# Patient Record
Sex: Male | Born: 1945
Health system: Southern US, Community
[De-identification: ages and names within clinical notes are randomized; demographics above are authoritative.]

## PROBLEM LIST (undated history)

## (undated) DIAGNOSIS — Z1231 Encounter for screening mammogram for malignant neoplasm of breast: Principal | ICD-10-CM

## (undated) DIAGNOSIS — C61 Malignant neoplasm of prostate: Principal | ICD-10-CM

## (undated) DIAGNOSIS — M79662 Pain in left lower leg: Secondary | ICD-10-CM

## (undated) DIAGNOSIS — M170 Bilateral primary osteoarthritis of knee: Secondary | ICD-10-CM

## (undated) DIAGNOSIS — Z136 Encounter for screening for cardiovascular disorders: Secondary | ICD-10-CM

## (undated) DIAGNOSIS — M25562 Pain in left knee: Secondary | ICD-10-CM

## (undated) DIAGNOSIS — F32A Depression, unspecified: Secondary | ICD-10-CM

## (undated) DIAGNOSIS — M1990 Unspecified osteoarthritis, unspecified site: Secondary | ICD-10-CM

## (undated) DIAGNOSIS — E785 Hyperlipidemia, unspecified: Secondary | ICD-10-CM

## (undated) DIAGNOSIS — T7840XA Allergy, unspecified, initial encounter: Secondary | ICD-10-CM

## (undated) DIAGNOSIS — R011 Cardiac murmur, unspecified: Secondary | ICD-10-CM

## (undated) DIAGNOSIS — F329 Major depressive disorder, single episode, unspecified: Secondary | ICD-10-CM

## (undated) DIAGNOSIS — J301 Allergic rhinitis due to pollen: Secondary | ICD-10-CM

## (undated) DIAGNOSIS — K219 Gastro-esophageal reflux disease without esophagitis: Secondary | ICD-10-CM

## (undated) HISTORY — DX: Cardiac murmur, unspecified: R01.1

## (undated) HISTORY — DX: Depression, unspecified: F32.A

## (undated) HISTORY — DX: Hyperlipidemia, unspecified: E78.5

## (undated) HISTORY — PX: KNEE SURGERY: SHX244

## (undated) HISTORY — PX: EYE SURGERY: SHX253

## (undated) HISTORY — DX: Gastro-esophageal reflux disease without esophagitis: K21.9

## (undated) HISTORY — DX: Major depressive disorder, single episode, unspecified: F32.9

## (undated) HISTORY — DX: Allergic rhinitis due to pollen: J30.1

## (undated) HISTORY — DX: Allergy, unspecified, initial encounter: T78.40XA

## (undated) HISTORY — DX: Unspecified osteoarthritis, unspecified site: M19.90

---

## 1999-08-02 ENCOUNTER — Emergency Department (HOSPITAL_COMMUNITY): Admission: EM | Admit: 1999-08-02 | Discharge: 1999-08-02 | Payer: Self-pay | Admitting: Emergency Medicine

## 1999-08-02 ENCOUNTER — Encounter: Payer: Self-pay | Admitting: Emergency Medicine

## 2005-04-29 ENCOUNTER — Ambulatory Visit: Payer: Self-pay | Admitting: Internal Medicine

## 2005-05-09 ENCOUNTER — Ambulatory Visit (HOSPITAL_COMMUNITY): Admission: RE | Admit: 2005-05-09 | Discharge: 2005-05-09 | Payer: Self-pay | Admitting: Internal Medicine

## 2005-05-09 ENCOUNTER — Encounter (INDEPENDENT_AMBULATORY_CARE_PROVIDER_SITE_OTHER): Payer: Self-pay | Admitting: *Deleted

## 2005-05-16 ENCOUNTER — Ambulatory Visit: Payer: Self-pay | Admitting: Internal Medicine

## 2005-05-31 ENCOUNTER — Ambulatory Visit: Payer: Self-pay | Admitting: *Deleted

## 2005-08-14 ENCOUNTER — Ambulatory Visit: Payer: Self-pay | Admitting: Internal Medicine

## 2005-09-10 ENCOUNTER — Ambulatory Visit: Payer: Self-pay | Admitting: Internal Medicine

## 2005-09-24 ENCOUNTER — Ambulatory Visit: Payer: Self-pay | Admitting: Internal Medicine

## 2005-12-17 ENCOUNTER — Ambulatory Visit: Payer: Self-pay | Admitting: Internal Medicine

## 2006-03-25 ENCOUNTER — Ambulatory Visit: Payer: Self-pay | Admitting: Internal Medicine

## 2006-05-16 ENCOUNTER — Ambulatory Visit: Payer: Self-pay | Admitting: Internal Medicine

## 2006-05-16 ENCOUNTER — Ambulatory Visit (HOSPITAL_COMMUNITY): Admission: RE | Admit: 2006-05-16 | Discharge: 2006-05-16 | Payer: Self-pay | Admitting: Internal Medicine

## 2006-08-12 ENCOUNTER — Ambulatory Visit: Payer: Self-pay | Admitting: Internal Medicine

## 2006-08-13 ENCOUNTER — Ambulatory Visit (HOSPITAL_COMMUNITY): Admission: RE | Admit: 2006-08-13 | Discharge: 2006-08-13 | Payer: Self-pay | Admitting: Internal Medicine

## 2006-09-12 ENCOUNTER — Ambulatory Visit: Payer: Self-pay | Admitting: Internal Medicine

## 2006-09-14 ENCOUNTER — Ambulatory Visit (HOSPITAL_COMMUNITY): Admission: RE | Admit: 2006-09-14 | Discharge: 2006-09-14 | Payer: Self-pay | Admitting: Internal Medicine

## 2006-09-24 ENCOUNTER — Ambulatory Visit: Payer: Self-pay | Admitting: Internal Medicine

## 2006-12-04 ENCOUNTER — Ambulatory Visit: Payer: Self-pay | Admitting: Internal Medicine

## 2007-02-11 ENCOUNTER — Encounter (INDEPENDENT_AMBULATORY_CARE_PROVIDER_SITE_OTHER): Payer: Self-pay | Admitting: *Deleted

## 2007-04-14 ENCOUNTER — Ambulatory Visit: Payer: Self-pay | Admitting: Internal Medicine

## 2007-04-14 LAB — CONVERTED CEMR LAB
AST: 25 units/L (ref 0–37)
Albumin: 4.9 g/dL (ref 3.5–5.2)
BUN: 16 mg/dL (ref 6–23)
Calcium: 9.5 mg/dL (ref 8.4–10.5)
Chloride: 101 meq/L (ref 96–112)
HDL: 57 mg/dL (ref >39)
Potassium: 4.8 meq/L (ref 3.5–5.3)
Valproic Acid Lvl: 72.3 ug/mL (ref 50.0–100.0)

## 2007-05-14 ENCOUNTER — Ambulatory Visit: Payer: Self-pay | Admitting: Internal Medicine

## 2007-05-16 ENCOUNTER — Ambulatory Visit (HOSPITAL_COMMUNITY): Admission: RE | Admit: 2007-05-16 | Discharge: 2007-05-16 | Payer: Self-pay | Admitting: Internal Medicine

## 2007-06-10 ENCOUNTER — Ambulatory Visit: Payer: Self-pay | Admitting: Internal Medicine

## 2007-08-26 ENCOUNTER — Ambulatory Visit: Payer: Self-pay | Admitting: Internal Medicine

## 2007-08-26 LAB — CONVERTED CEMR LAB
ALT: 31 units/L (ref 0–53)
Albumin: 4.6 g/dL (ref 3.5–5.2)
CO2: 27 meq/L (ref 19–32)
Calcium: 9.2 mg/dL (ref 8.4–10.5)
Chloride: 103 meq/L (ref 96–112)
Cholesterol: 126 mg/dL (ref 0–200)
Potassium: 4.7 meq/L (ref 3.5–5.3)
Sodium: 142 meq/L (ref 135–145)
Total Protein: 7.3 g/dL (ref 6.0–8.3)

## 2007-09-03 ENCOUNTER — Ambulatory Visit: Payer: Self-pay | Admitting: Internal Medicine

## 2008-03-29 ENCOUNTER — Ambulatory Visit: Payer: Self-pay | Admitting: Internal Medicine

## 2008-04-12 ENCOUNTER — Ambulatory Visit: Payer: Self-pay | Admitting: Internal Medicine

## 2008-04-19 ENCOUNTER — Ambulatory Visit: Payer: Self-pay | Admitting: Family Medicine

## 2008-05-12 ENCOUNTER — Ambulatory Visit: Payer: Self-pay | Admitting: Internal Medicine

## 2009-08-08 ENCOUNTER — Encounter (INDEPENDENT_AMBULATORY_CARE_PROVIDER_SITE_OTHER): Payer: Self-pay | Admitting: *Deleted

## 2009-09-08 ENCOUNTER — Ambulatory Visit (HOSPITAL_COMMUNITY): Admission: EM | Admit: 2009-09-08 | Discharge: 2009-09-09 | Payer: Self-pay | Admitting: Emergency Medicine

## 2009-09-09 ENCOUNTER — Ambulatory Visit: Payer: Self-pay | Admitting: Gastroenterology

## 2009-09-11 ENCOUNTER — Telehealth (INDEPENDENT_AMBULATORY_CARE_PROVIDER_SITE_OTHER): Payer: Self-pay | Admitting: *Deleted

## 2009-09-11 ENCOUNTER — Encounter (INDEPENDENT_AMBULATORY_CARE_PROVIDER_SITE_OTHER): Payer: Self-pay | Admitting: *Deleted

## 2009-09-11 DIAGNOSIS — R1319 Other dysphagia: Secondary | ICD-10-CM | POA: Insufficient documentation

## 2009-11-20 ENCOUNTER — Encounter (INDEPENDENT_AMBULATORY_CARE_PROVIDER_SITE_OTHER): Payer: Self-pay | Admitting: *Deleted

## 2009-11-30 ENCOUNTER — Encounter (INDEPENDENT_AMBULATORY_CARE_PROVIDER_SITE_OTHER): Payer: Self-pay | Admitting: *Deleted

## 2009-12-01 ENCOUNTER — Ambulatory Visit: Payer: Self-pay | Admitting: Gastroenterology

## 2010-03-04 ENCOUNTER — Emergency Department (HOSPITAL_COMMUNITY): Admission: EM | Admit: 2010-03-04 | Discharge: 2010-03-04 | Payer: Self-pay | Admitting: Emergency Medicine

## 2010-06-28 NOTE — Procedures (Signed)
Summary: Upper Endoscopy  Patient: [name] Note: All result statuses are Final unless otherwise noted.  Tests: (1) Upper Endoscopy (EGD)   EGD Upper Endoscopy       DONE     [facility]     [address] [place], [place]  [address]           ENDOSCOPY PROCEDURE REPORT           PATIENT:  Casey Ashley, Casey Ashley  MR#:  [id number]     BIRTHDATE:  24-Dec-1945, 63 yrs. old  GENDER:  male           ENDOSCOPIST:  Rachael Fee, MD           PROCEDURE DATE:  09/09/2009     PROCEDURE:  EGD with foreign body removal     ASA CLASS:  Class II     INDICATIONS:  food impaction           MEDICATIONS:  Fentanyl 75 mcg IV, Versed 6 mg IV     TOPICAL ANESTHETIC:  none           DESCRIPTION OF PROCEDURE:   After the risks benefits and     alternatives of the procedure were thoroughly explained, informed     consent was obtained.  The  endoscope was introduced through the     mouth and advanced to the second portion of the duodenum, without     limitations.  The instrument was slowly withdrawn as the mucosa     was fully examined.     <<PROCEDUREIMAGES>>           There was a bolus of solid whitish food in distal esophagus that     was able to be gently pushed into the stomach with endoscope.     There was a thick Schatzki's type ring vs peptic stricture at GE     junction. The entire esophagus was edematous, erythematous. There     was a large amount of retained solid food in stomach. Otherwise     the examination was normal (see image1, image2, image3, image4,     image5, image7, image8, and image9).    Retroflexed views revealed     no abnormalities.    The scope was then withdrawn from the patient     and the procedure completed.           COMPLICATIONS:  None           ENDOSCOPIC IMPRESSION:     1) Retained food in esophagus, above thick Schatzki's ring vs     peptic stricture     2) Esophagus appeared moderately inflammed throughout, probably     resulting from the  food impaction and retching tonight.           RECOMMENDATIONS:     1. He will start once daily over the counter PPI (prilosec or     prevacid). This is best taken 20-30 min prior to breakfast meal.     2.  He will maintain full liquid diet for 24 hours, then OK to     advance but he knows to chew food VERY WELL and eat slowly.     3.  Dr. Christella Hartigan' office with contact him to schedule repeat EGD     with dilation at [facility] in about 2 weeks.           ______________________________     Rachael Fee,  MD           n.     Rosalie DoctorRachael Fee at 09/09/2009 04:04 AM           Casey Ashley, [id number]  Note: An exclamation mark (!) indicates a result that was not dispersed into the flowsheet. Document Creation Date: 09/09/2009 4:04 AM _______________________________________________________________________  (1) Order result status: Final Collection or observation date-time: 09/09/2009 03:56 Requested date-time:  Receipt date-time:  Reported date-time:  Referring Physician:   Ordering Physician: Rob Bunting [phone]) Specimen Source:  Source: Launa Grill Order Number: [id number] Lab site:   Appended Document: Upper Endoscopy patty, he needs EGD in 2 weeks for dilation at [facility] with balloon (on an EUS thursday)  Appended Document: Upper Endoscopy appt scheduled for 09/28/09

## 2010-06-28 NOTE — Progress Notes (Signed)
Summary: EGD   Phone Note Outgoing Call Call back at [facility] Phone [phone]   Call placed by: Chales Abrahams CMA Duncan Dull),  September 11, 2009 8:40 AM Summary of Call: pt schedueld for repeat EGD ballon dil review meds and instruct pt.   Initial call taken by: Chales Abrahams CMA Duncan Dull),  September 11, 2009 8:41 AM  Follow-up for Phone Call        left message on machine to call back Chales Abrahams CMA Duncan Dull)  September 11, 2009 9:14 AM   Called to give pt the appt.  and he refuses to have the  EGD he says he does not need it he is doing fine.  I explained that I would let Dr Christella Hartigan know but he may still want to do the procedure.  I will call him back after talking with Dr Christella Hartigan.  Chales Abrahams CMA Duncan Dull)  September 11, 2009 9:33 AM   Additional Follow-up for Phone Call Additional follow up Details #1::        call him back, he needs the procedure.  if he still declines please send him a note saying I recommended he have the repeat procedure done, Additional Follow-up by: Rachael Fee MD,  September 11, 2009 10:13 AM  New Problems: OTHER DYSPHAGIA [phone])   Additional Follow-up for Phone Call Additional follow up Details #2::    left message on machine to call back Chales Abrahams CMA (AAMA)  September 11, 2009 10:18 AM   Pt continues to decline to have procedure.  He was told that Dr Christella Hartigan still recommends he have the EGD and a letter will be sent,  he will call with any further problems.  Follow-up by: Chales Abrahams CMA Duncan Dull),  September 11, 2009 10:26 AM  New Problems: OTHER DYSPHAGIA [phone])

## 2010-06-28 NOTE — Letter (Signed)
Summary: Results Letter  [facility]  [address] [place], [place] [address]   Phone: [phone]  Fax: [phone]        September 11, 2009 MRN: [id number]    Casey Ashley [address] [place], [place]  [address]    Dear Mr. Mccaslin,  As per our conversation, Dr Christella Hartigan recommends you have an EGD at [facility].  You have declined to have this procedure at this time.  Please be aware that Dr Christella Hartigan does feel that you should have this done at your soonest convenience.  Please call if you have any further problems.       Sincerely,  Chales Abrahams CMA (AAMA)  This letter has been electronically signed by your physician.  Appended Document: Results Letter letter mailed

## 2010-06-28 NOTE — Letter (Signed)
Summary: Previsit letter  [facility]  [address] [place], [place] [address]   Phone: [phone]  Fax: [phone]       11/20/2009 MRN: [id number]  Casey Ashley [address] [place], [place]  [address]  Dear [name],  Welcome to the Gastroenterology Division at [facility].    You are scheduled to see a nurse for your pre-procedure visit on 12-25-09 at 9:00a.m. on the 3rd floor at [facility], [address].  We ask that you try to arrive at our office 15 minutes prior to your appointment time to allow for check-in.  Your nurse visit will consist of discussing your medical and surgical history, your immediate family medical history, and your medications.    Please bring a complete list of all your medications or, if you prefer, bring the medication bottles and we will list them.  We will need to be aware of both prescribed and over the counter drugs.  We will need to know exact dosage information as well.  If you are on blood thinners (Coumadin, Plavix, Aggrenox, Ticlid, etc.) please call our office today/prior to your appointment, as we need to consult with your physician about holding your medication.   Please be prepared to read and sign documents such as consent forms, a financial agreement, and acknowledgement forms.  If necessary, and with your consent, a friend or relative is welcome to sit-in on the nurse visit with you.  Please bring your insurance card so that we may make a copy of it.  If your insurance requires a referral to see a specialist, please bring your referral form from your primary care physician.  No co-pay is required for this nurse visit.     If you cannot keep your appointment, please call [phone] to cancel or reschedule prior to your appointment date.  This allows [place] the opportunity to schedule an appointment for another patient in need of care.    Thank you for choosing [facility] for your medical needs.  We  appreciate the opportunity to care for you.  Please visit [place] at our website  to learn more about our practice.                     Sincerely.                                                                                                                   The Gastroenterology Division

## 2010-06-28 NOTE — Letter (Signed)
Summary: EGD Instructions  [facility]  [address] [place], [place] [address]   Phone: [phone]  Fax: [phone]       CAYMAN KIELBASA    June 14, 1945    MRN: [id number]       Procedure Day /Date:09/28/09       Arrival Time: 11 am     Procedure Time:12 noon     Location of Procedure:                     [facility] ( Outpatient Registration)    PREPARATION FOR ENDOSCOPY   On 09/28/09  THE DAY OF THE PROCEDURE:  1.   No solid foods, milk or milk products are allowed after midnight the night before your procedure.  2.   Do not drink anything colored red or purple.  Avoid juices with pulp.  No orange juice.  3.  You may drink clear liquids until 8 am , which is 4 hours before your procedure.                                                                                                CLEAR LIQUIDS INCLUDE: Water Jello Ice Popsicles Tea (sugar ok, no milk/cream) Powdered fruit flavored drinks Coffee (sugar ok, no milk/cream) Gatorade Juice: apple, white grape, white cranberry  Lemonade Clear bullion, consomm, broth Carbonated beverages (any kind) Strained chicken noodle soup Hard Candy   MEDICATION INSTRUCTIONS  Unless otherwise instructed, you should take regular prescription medications with a small sip of water as early as possible the morning of your procedure.             OTHER INSTRUCTIONS  You will need a responsible adult at least 67 years of age to accompany you and drive you home.   This person must remain in the waiting room during your procedure.  Wear loose fitting clothing that is easily removed.  Leave jewelry and other valuables at home.  However, you may wish to bring a book to read or an iPod/MP3 player to listen to music as you wait for your procedure to start.  Remove all body piercing jewelry and leave at home.  Total time from sign-in until discharge is approximately 2-3 hours.  You should go home  directly after your procedure and rest.  You can resume normal activities the day after your procedure.  The day of your procedure you should not:   Drive   Make legal decisions   Operate machinery   Drink alcohol   Return to work  You will receive specific instructions about eating, activities and medications before you leave.    The above instructions have been reviewed and explained to me by   Chales Abrahams CMA Duncan Dull)  September 11, 2009 8:43 AM      I fully understand and can verbalize these instructions over the phone mailed to [facility] 09/11/09     Appended Document: EGD Instructions pt declined procedure

## 2010-06-28 NOTE — Letter (Signed)
Summary: Previsit letter  [facility]  [address] [place], [place] [address]   Phone: [phone]  Fax: [phone]       08/08/2009 MRN: [id number]  Casey Ashley [address] [place], [place]  [address]  Dear [name],  Welcome to the Gastroenterology Division at [facility].    You are scheduled to see a nurse for your pre-procedure visit on 08-22-09 at 2:00p.m. on the 3rd floor at [facility], [address].  We ask that you try to arrive at our office 15 minutes prior to your appointment time to allow for check-in.  Your nurse visit will consist of discussing your medical and surgical history, your immediate family medical history, and your medications.    Please bring a complete list of all your medications or, if you prefer, bring the medication bottles and we will list them.  We will need to be aware of both prescribed and over the counter drugs.  We will need to know exact dosage information as well.  If you are on blood thinners (Coumadin, Plavix, Aggrenox, Ticlid, etc.) please call our office today/prior to your appointment, as we need to consult with your physician about holding your medication.   Please be prepared to read and sign documents such as consent forms, a financial agreement, and acknowledgement forms.  If necessary, and with your consent, a friend or relative is welcome to sit-in on the nurse visit with you.  Please bring your insurance card so that we may make a copy of it.  If your insurance requires a referral to see a specialist, please bring your referral form from your primary care physician.  No co-pay is required for this nurse visit.     If you cannot keep your appointment, please call [phone] to cancel or reschedule prior to your appointment date.  This allows [place] the opportunity to schedule an appointment for another patient in need of care.    Thank you for choosing [facility] for your medical needs.  We  appreciate the opportunity to care for you.  Please visit [place] at our website  to learn more about our practice.                     Sincerely.                                                                                                                   The Gastroenterology Division

## 2010-06-28 NOTE — Miscellaneous (Signed)
Summary: previsit/rm  Clinical Lists Changes  Medications: Added new medication of MOVIPREP 100 GM  SOLR (PEG-KCL-NACL-NASULF-NA ASC-C) As per prep instructions. - Signed Rx of MOVIPREP 100 GM  SOLR (PEG-KCL-NACL-NASULF-NA ASC-C) As per prep instructions.;  #1 x 0;  Signed;  Entered by: Sherren Kerns RN;  Authorized by: Rachael Fee MD;  Method used: Electronically to [facility] [id number]*, [address] [place], [place], [place]  [address], Ph: [phone] or [phone], Fax: [phone] Observations: Added new observation of ALLERGY REV: Done (12/01/2009 14:04) Added new observation of NKA: T (12/01/2009 14:04)    Prescriptions: MOVIPREP 100 GM  SOLR (PEG-KCL-NACL-NASULF-NA ASC-C) As per prep instructions.  #1 x 0   Entered by:   Sherren Kerns RN   Authorized by:   Rachael Fee MD   Signed by:   Sherren Kerns RN on 12/01/2009   Method used:   Electronically to        [id number]* (retail)       [address]       [place], [place]  [address]       Ph: [phone] or [phone]       Fax: [phone]   RxID:   [id number]

## 2010-06-28 NOTE — Letter (Signed)
Summary: [facility] Instructions  [facility]  [address] [place], [place] [address]   Phone: [phone]  Fax: [phone]       Casey Ashley    10-06-1945    MRN: [id number]        Procedure Day /Date:  12/08/09  Friday     Arrival Time:  8:00am     Procedure Time: 9:00am     Location of Procedure:                    _x _  [facility] (4th Floor)   PREPARATION FOR COLONOSCOPY WITH MOVIPREP   Starting 5 days prior to your procedure _7/10/11 _ do not eat nuts, seeds, popcorn, corn, beans, peas,  salads, or any raw vegetables.  Do not take any fiber supplements (e.g. Metamucil, Citrucel, and Benefiber).  THE DAY BEFORE YOUR PROCEDURE         DATE:   12/07/09  DAY:  Thursday  1.  Drink clear liquids the entire day-NO SOLID FOOD  2.  Do not drink anything colored red or purple.  Avoid juices with pulp.  No orange juice.  3.  Drink at least 64 oz. (8 glasses) of fluid/clear liquids during the day to prevent dehydration and help the prep work efficiently.  CLEAR LIQUIDS INCLUDE: Water Jello Ice Popsicles Tea (sugar ok, no milk/cream) Powdered fruit flavored drinks Coffee (sugar ok, no milk/cream) Gatorade Juice: apple, white grape, white cranberry  Lemonade Clear bullion, consomm, broth Carbonated beverages (any kind) Strained chicken noodle soup Hard Candy                             4.  In the morning, mix first dose of MoviPrep solution:    Empty 1 Pouch A and 1 Pouch B into the disposable container    Add lukewarm drinking water to the top line of the container. Mix to dissolve    Refrigerate (mixed solution should be used within 24 hrs)  5.  Begin drinking the prep at 5:00 p.m. The MoviPrep container is divided by 4 marks.   Every 15 minutes drink the solution down to the next mark (approximately 8 oz) until the full liter is complete.   6.  Follow completed prep with 16 oz of clear liquid of your choice (Nothing red or purple).   Continue to drink clear liquids until bedtime.  7.  Before going to bed, mix second dose of MoviPrep solution:    Empty 1 Pouch A and 1 Pouch B into the disposable container    Add lukewarm drinking water to the top line of the container. Mix to dissolve    Refrigerate  THE DAY OF YOUR PROCEDURE      DATE:   12/08/09  DAY:  Friday  Beginning at  4:00am  (5 hours before procedure):         1. Every 15 minutes, drink the solution down to the next mark (approx 8 oz) until the full liter is complete.  2. Follow completed prep with 16 oz. of clear liquid of your choice.    3. You may drink clear liquids until   7:00am (2 HOURS BEFORE PROCEDURE).   MEDICATION INSTRUCTIONS  Unless otherwise instructed, you should take regular prescription medications with a small sip of water   as early as possible the morning of your procedure.   Additional medication instructions: n/a  OTHER INSTRUCTIONS  You will need a responsible adult at least 67 years of age to accompany you and drive you home.   This person must remain in the waiting room during your procedure.  Wear loose fitting clothing that is easily removed.  Leave jewelry and other valuables at home.  However, you may wish to bring a book to read or  an iPod/MP3 player to listen to music as you wait for your procedure to start.  Remove all body piercing jewelry and leave at home.  Total time from sign-in until discharge is approximately 2-3 hours.  You should go home directly after your procedure and rest.  You can resume normal activities the  day after your procedure.  The day of your procedure you should not:   Drive   Make legal decisions   Operate machinery   Drink alcohol   Return to work  You will receive specific instructions about eating, activities and medications before you leave.    The above instructions have been reviewed and explained to me by   Sherren Kerns RN  December 01, 2009 2:40  PM     I fully understand and can verbalize these instructions _____________________________ Date _________

## 2013-09-01 ENCOUNTER — Encounter (HOSPITAL_COMMUNITY): Payer: Self-pay | Admitting: Emergency Medicine

## 2013-09-01 ENCOUNTER — Encounter (HOSPITAL_COMMUNITY)
Admission: EM | Disposition: A | Discharge status: Home / Self Care | Payer: Self-pay | Source: Home / Self Care | Attending: Emergency Medicine

## 2013-09-01 ENCOUNTER — Emergency Department (HOSPITAL_COMMUNITY)
Admission: EM | Admit: 2013-09-01 | Discharge: 2013-09-01 | Disposition: A | Discharge status: Home / Self Care | Payer: Medicare Other | Attending: Emergency Medicine | Admitting: Emergency Medicine

## 2013-09-01 DIAGNOSIS — Y929 Unspecified place or not applicable: Secondary | ICD-10-CM | POA: Diagnosis not present

## 2013-09-01 DIAGNOSIS — K222 Esophageal obstruction: Secondary | ICD-10-CM | POA: Insufficient documentation

## 2013-09-01 DIAGNOSIS — Z8719 Personal history of other diseases of the digestive system: Secondary | ICD-10-CM | POA: Insufficient documentation

## 2013-09-01 DIAGNOSIS — T18108A Unspecified foreign body in esophagus causing other injury, initial encounter: Secondary | ICD-10-CM | POA: Diagnosis not present

## 2013-09-01 DIAGNOSIS — IMO0002 Reserved for concepts with insufficient information to code with codable children: Secondary | ICD-10-CM | POA: Insufficient documentation

## 2013-09-01 DIAGNOSIS — F17200 Nicotine dependence, unspecified, uncomplicated: Secondary | ICD-10-CM | POA: Diagnosis not present

## 2013-09-01 DIAGNOSIS — Y9389 Activity, other specified: Secondary | ICD-10-CM | POA: Insufficient documentation

## 2013-09-01 DIAGNOSIS — T17308A Unspecified foreign body in larynx causing other injury, initial encounter: Secondary | ICD-10-CM | POA: Diagnosis present

## 2013-09-01 HISTORY — PX: ESOPHAGOGASTRODUODENOSCOPY: SHX5428

## 2013-09-01 LAB — BASIC METABOLIC PANEL
BUN: 20 mg/dL (ref 6–23)
CALCIUM: 10.3 mg/dL (ref 8.4–10.5)
CO2: 29 mEq/L (ref 19–32)
Chloride: 100 mEq/L (ref 96–112)
Creatinine, Ser: 0.85 mg/dL (ref 0.50–1.35)
GFR, EST NON AFRICAN AMERICAN: 87 mL/min — AB (ref >90)
GLUCOSE: 107 mg/dL — AB (ref 70–99)
Potassium: 4.8 mEq/L (ref 3.7–5.3)
SODIUM: 141 meq/L (ref 137–147)

## 2013-09-01 LAB — CBC
HCT: 45.7 % (ref 39.0–52.0)
HEMOGLOBIN: 15.7 g/dL (ref 13.0–17.0)
MCH: 32.3 pg (ref 26.0–34.0)
MCHC: 34.4 g/dL (ref 30.0–36.0)
MCV: 94 fL (ref 78.0–100.0)
PLATELETS: 238 10*3/uL (ref 150–400)
RBC: 4.86 MIL/uL (ref 4.22–5.81)
RDW: 13.3 % (ref 11.5–15.5)
WBC: 7.7 10*3/uL (ref 4.0–10.5)

## 2013-09-01 SURGERY — EGD (ESOPHAGOGASTRODUODENOSCOPY)
Anesthesia: Moderate Sedation

## 2013-09-01 MED ORDER — DIPHENHYDRAMINE HCL 50 MG/ML IJ SOLN
INTRAMUSCULAR | Status: AC
  Filled 2013-09-01: qty 1

## 2013-09-01 MED ORDER — FENTANYL CITRATE 0.05 MG/ML IJ SOLN
INTRAMUSCULAR | Status: DC | PRN
  Administered 2013-09-01 (×4): 25 ug via INTRAVENOUS

## 2013-09-01 MED ORDER — GLUCAGON HCL (RDNA) 1 MG IJ SOLR
1.0000 mg | Freq: Once | INTRAMUSCULAR | Status: AC
  Administered 2013-09-01: 1 mg via INTRAVENOUS
  Filled 2013-09-01: qty 1

## 2013-09-01 MED ORDER — FENTANYL CITRATE 0.05 MG/ML IJ SOLN
INTRAMUSCULAR | Status: AC
  Filled 2013-09-01: qty 4

## 2013-09-01 MED ORDER — ONDANSETRON HCL 4 MG/2ML IJ SOLN
4.0000 mg | Freq: Once | INTRAMUSCULAR | Status: AC
  Administered 2013-09-01: 4 mg via INTRAVENOUS
  Filled 2013-09-01: qty 2

## 2013-09-01 MED ORDER — BUTAMBEN-TETRACAINE-BENZOCAINE 2-2-14 % EX AERO
INHALATION_SPRAY | CUTANEOUS | Status: DC | PRN
  Administered 2013-09-01: 2 via TOPICAL

## 2013-09-01 MED ORDER — SODIUM CHLORIDE 0.9 % IV SOLN
INTRAVENOUS | Status: DC
  Administered 2013-09-01: 16:00:00 via INTRAVENOUS

## 2013-09-01 MED ORDER — SODIUM CHLORIDE 0.9 % IV SOLN: INTRAVENOUS | Status: DC

## 2013-09-01 MED ORDER — MIDAZOLAM HCL 10 MG/2ML IJ SOLN
INTRAMUSCULAR | Status: DC | PRN
  Administered 2013-09-01 (×4): 2 mg via INTRAVENOUS

## 2013-09-01 MED ORDER — MIDAZOLAM HCL 10 MG/2ML IJ SOLN
INTRAMUSCULAR | Status: AC
  Filled 2013-09-01: qty 4

## 2013-09-01 NOTE — ED Notes (Signed)
Bed: [facility] Expected date:  Expected time:  Means of arrival:  Comments: Pt from endo

## 2013-09-01 NOTE — Discharge Instructions (Signed)
Esophageal Stricture °The esophagus is the long, narrow tube which carries food and liquid from the mouth to the stomach. Sometimes a part of the esophagus becomes narrow and makes it difficult, painful, or even impossible to swallow. This is called an esophageal stricture.  °CAUSES  °Common causes of blockage or strictures of the esophagus are: °· Exposure of the lower esophagus to the acid from the stomach may cause narrowing. °· Hiatal hernia in which a small part of the stomach bulges up through the diaphragm can cause a narrowing in the bottom of the esophagus. °· Scleroderma is a tissue disorder that affects the esophagus and makes swallowing difficult. °· Achalasia is an absence of nerves in the lower esophagus and to the esophageal sphincter. This absence of nerves may be congenital (present since birth). This can cause irregular spasms which do not allow food and fluid through. °· Strictures may develop from swallowing materials which damage the esophagus. Examples are acids or alkalis such as lye. °· Schatzki's Ring is a narrow ring of non-cancerous tissue which narrows the lower esophagus. The cause of this is unknown. °· Growths can block the esophagus. °SYMPTOMS  °Some of the problems are difficulty swallowing or pain with swallowing. °DIAGNOSIS  °Your caregiver often suspects this problem by taking a medical history. They will also do a physical exam. They may then take X-rays and/or perform an endoscopy. Endoscopy is an exam in which a tube like a small flexible telescope is used to look at your esophagus.  °TREATMENT °· One form of treatment is to dilate the narrow area. This means to stretch it. °· When this is not successful, chest surgery may be required. This is a much more extensive form of treatment with a longer recovery time. °Both of the above treatments make the passage of food and water into the stomach easier. They also make it easier for stomach contents to bubble back into the  esophagus. Special medications may be used following the procedure to help prevent further narrowing. Medications may be used to lower the amount of acid in the stomach juice.  °SEEK IMMEDIATE MEDICAL CARE IF:  °· Your swallowing is becoming more painful, difficult, or you are unable to swallow. °· You vomit up blood. °· You develop black tarry stools. °· You develop chills. °· You have a fever. °· You develop chest or abdominal pain. °· You develop shortness of breath, feel lightheaded, or faint. °Follow up with medical care as your caregiver suggests. °Document Released: 01/21/2006 Document Revised: 08/05/2011 Document Reviewed: 02/27/2006 °[employer]® Patient Information ©2014 [employer]. ° °

## 2013-09-01 NOTE — ED Provider Notes (Signed)
TIME SEEN: 3:29 PM  CHIEF COMPLAINT: Esophageal foreign body  HPI: Patient is a 68 year old male with a history of esophageal stricture/Schaztki's ring status post 2 dilations, last was in [place] in 2014 and also 2011 with [facility] who presents the emergency department with esophageal foreign body and unable to swallow his saliva. He stated at 2 PM today he was eating a ham sandwich and drinking well. He felt like the hands and which got stuck in his esophagus and he has been unable to swallow his own saliva. He did not aspirate. He is not having difficulty breathing. No shortness of breath. No wheezing or stridor. No voice changes. He denies any other medical history. No fevers, chest pain, abdominal pain.  ROS: See HPI Constitutional: no fever  Eyes: no drainage  ENT: no runny nose   Cardiovascular:  no chest pain  Resp: no SOB  GI: no vomiting GU: no dysuria Integumentary: no rash  Allergy: no hives  Musculoskeletal: no leg swelling  Neurological: no slurred speech ROS otherwise negative  PAST MEDICAL HISTORY/PAST SURGICAL HISTORY:  History reviewed. No pertinent past medical history.  MEDICATIONS:  Prior to Admission medications   Not on File    ALLERGIES:  No Known Allergies  SOCIAL HISTORY:  History  Substance Use Topics  . Smoking status: Current Every Day Smoker  . Smokeless tobacco: Not on file  . Alcohol Use: Yes    FAMILY HISTORY: History reviewed. No pertinent family history.  EXAM: BP 159/90  Pulse 100  Temp(Src) 98.9 F (37.2 C) (Oral)  Resp 16  SpO2 100% CONSTITUTIONAL: Alert and oriented and responds appropriately to questions. Appears uncomfortable, frequently spitting saliva into an emesis bag HEAD: Normocephalic EYES: Conjunctivae clear, PERRL ENT: normal nose; no rhinorrhea; moist mucous membranes; pharynx without lesions noted, airway is patent, no trismus or drooling, no stridor NECK: Supple, no meningismus, no LAD  CARD: RRR; S1 and S2  appreciated; no murmurs, no clicks, no rubs, no gallops RESP: Normal chest excursion without splinting or tachypnea; breath sounds clear and equal bilaterally; no wheezes, no rhonchi, no rales, no respiratory distress ABD/GI: Normal bowel sounds; non-distended; soft, non-tender, no rebound, no guarding BACK:  The back appears normal and is non-tender to palpation, there is no CVA tenderness EXT: Normal ROM in all joints; non-tender to palpation; no edema; normal capillary refill; no cyanosis    SKIN: Normal color for age and race; warm NEURO: Moves all extremities equally PSYCH: The patient's mood and manner are appropriate. Grooming and personal hygiene are appropriate.  MEDICAL DECISION MAKING: Patient here with esophageal foreign body with history of esophageal strictures. He is unable to swallow his own saliva. We'll give glucagon and Zofran and reassess. If this does not work, will consult gastroenterology.  ED PROGRESS: Labs are unremarkable. Patient still unable to swallow after glucagon and Zofran. [facility] consult gastroenterology.   5:26 PM  Pt still unable to swallow. Discussed with Dr. Marina Goodell with [facility] gastroenterology. Will take patient to endoscopy.  8:04 PM  Pt had disimpaction of his distal food bolus. Plan is to followup with Dr. Christella Hartigan for esophageal dilation as an outpatient. Patient is awake, alert, able to ambulate, has no oxygen requirement. His sister-in-law that bedtime. Have reiterated return precautions and plan of care with the patient. Plan is to start him on omeprazole 20 mg once daily, keep n.p.o. for the next few hours, clear liquids until tomorrow morning and then soft diet until he has been seen by gastroenterology.  Layla MawKristen N Aleeta Schmaltz, DO 09/01/13 2039

## 2013-09-01 NOTE — H&P (Signed)
  HISTORY OF PRESENT ILLNESS:  Casey GrieveRobert E Ashley is a 68 y.o. male with no significant past medical history who presents to the emergency room with food impaction after eating ham sandwich at 2 PM. Has had food impactions on 2 previous occasions. Last year elsewhere and 2011 (taken care of by Dr. Christella HartiganJacobs). Previous endoscopy report from here reviewed. Was asked to stay on PPI and followup in the office. Did neither. He denies chest pain, shortness of breath, or other issues.  REVIEW OF SYSTEMS:  All non-GI ROS negative.  History reviewed. No pertinent past medical history.  Past Surgical History  Procedure Laterality Date  . Eye surgery    . Knee surgery      Social History Casey GrieveRobert E Ashley  reports that he has been smoking.  He does not have any smokeless tobacco history on file. He reports that he drinks alcohol. His drug history is not on file.  family history is not on file.  No Known Allergies     PHYSICAL EXAMINATION: Vital signs: BP 159/90  Pulse 100  Temp(Src) 98.9 F (37.2 C) (Oral)  Resp 16  SpO2 100% General: Well-developed, well-nourished, no acute distress. Spitting in [place] HEENT: Sclerae are anicteric, conjunctiva pink. Oral mucosa intact Lungs: Clear Heart: Regular Abdomen: soft, nontender, nondistended, no obvious ascites, no peritoneal signs, normal bowel sounds. No organomegaly. Extremities: No edema Psychiatric: alert and oriented x3. Cooperative   ASSESSMENT:  #1. Acute food impaction. History of recurrent food impaction. Not handling secretions. #2. Underlying esophageal stricture or ring previously documented  PLAN:  #1. Emergent endoscopy.The nature of the procedure, as well as the risks, benefits, and alternatives were carefully and thoroughly reviewed with the patient. Ample time for discussion and questions allowed. The patient understood, was satisfied, and agreed to proceed. #2. Long-term we'll need PPI and repeat endoscopy with esophageal  dilation. He will be asked contact our office to set this up with Dr. Christella HartiganJacobs. I explained the importance of followup carefully. He acknowledged such

## 2013-09-01 NOTE — ED Notes (Signed)
Pt unable to swallow.  Spitting into bag now.

## 2013-09-01 NOTE — Op Note (Signed)
[facility] [address] [place], [id number]   ENDOSCOPY PROCEDURE REPORT  PATIENT: Casey Ashley, [name]  MR#: [id number] BIRTHDATE: 04/26/1946 , 67  yrs. old GENDER: Male ENDOSCOPIST: Roxy CedarJohn N Perry Jr, MD REFERRED BY:  Rochele RaringKristen Ward, D.O. (emergency room physician) PROCEDURE DATE:  09/01/2013 PROCEDURE:  EGD w/ foreign body/food removal ASA CLASS:     Class I INDICATIONS:  Dysphagia secondary to food impaction.   Therapeutic procedure. MEDICATIONS: Fentanyl 100 mcg IV and Versed 8 mg IV TOPICAL ANESTHETIC: Cetacaine Spray  DESCRIPTION OF PROCEDURE: After the risks benefits and alternatives of the procedure were thoroughly explained, informed consent was obtained.  The Pentax Gastroscope [id number] endoscope was introduced through the mouth and advanced to the second portion of the duodenum. Without limitations.  The instrument was slowly withdrawn as the mucosa was fully examined.    EXAM:The esophagus revealed retained fluid in the distal portion. This was suctioned.  Underlying strictures or rings present.  Food impaction noted distally.  This was gently advanced into the stomach.  Underlying dominant stricture or ring with edema.  No significant trauma.  Stomach was normal except for nonspecific erythema.  The duodenum was normal.  Retroflexed views revealed a hiatal hernia.     The scope was then withdrawn from the patient and the procedure completed.  COMPLICATIONS: There were no complications. ENDOSCOPIC IMPRESSION: 1. Esophageal food impaction status post successful endoscopic removal 2. Benign esophageal strictures/rings distally  RECOMMENDATIONS: 1. N.p.o. for 2 hours, clear liquids until a.m., and soft foods until GI followup 2. Omeprazole (Prilosec OTC) 20 mg daily indefinitely 3. Contact Dr. Rob Buntinganiel Jacobs office regarding outpatient followup. Will need esophageal dilation. The office number is [phone]  REPEAT EXAM:  eSigned:  Roxy CedarJohn N Perry Jr,  MD 09/01/2013 7:05 PM   XB:[id number]:Daniel Christella HartiganJacobs, MD and The Patient

## 2013-09-01 NOTE — ED Notes (Signed)
He is taken to Endoscopy unit by one of their nurses at this time.  He is still spitting and "feel the same".

## 2013-09-01 NOTE — ED Notes (Signed)
Pt alert and oriented x 4. Sister in law at bedside along with patient receiving discharge information. Pt ambulated with steady gait and maintains O2 sats at 97% on room air.

## 2013-09-01 NOTE — ED Notes (Signed)
He tells us he has been "choking" ever since eating a meat sandwich earlier today.  He is happy to get his IV meds and thanks us.  He spits frequently and is breathing normally and in no distress.

## 2013-09-01 NOTE — ED Notes (Signed)
Pt states that he was eating a ham sandwich and aspirated some of it.  Pt has hx of same.  Pt went to endoscopy the last time.

## 2013-09-02 ENCOUNTER — Encounter (HOSPITAL_COMMUNITY): Payer: Self-pay | Admitting: Internal Medicine

## 2014-03-27 DEATH — deceased

## 2014-06-09 ENCOUNTER — Encounter (HOSPITAL_COMMUNITY): Payer: Self-pay | Admitting: Internal Medicine

## 2014-11-30 ENCOUNTER — Inpatient Hospital Stay: Attending: Family Medicine | Primary: Family Medicine

## 2014-11-30 ENCOUNTER — Ambulatory Visit: Admit: 2014-11-30 | Primary: Family Medicine

## 2014-11-30 ENCOUNTER — Encounter

## 2014-11-30 DIAGNOSIS — Z136 Encounter for screening for cardiovascular disorders: Secondary | ICD-10-CM

## 2015-03-27 ENCOUNTER — Inpatient Hospital Stay: Admit: 2015-03-27 | Attending: Family Medicine | Primary: Family Medicine

## 2015-03-27 ENCOUNTER — Encounter

## 2015-03-27 DIAGNOSIS — M25562 Pain in left knee: Secondary | ICD-10-CM

## 2015-06-07 ENCOUNTER — Emergency Department: Admit: 2015-06-08 | Primary: Family Medicine

## 2015-06-07 NOTE — ED Notes (Signed)
Pt placed on tele pack. Called CMR to confirm     DARTANIAN FAHRENKRUG, RN  06/07/15 2209

## 2015-06-07 NOTE — ED Notes (Signed)
Pt states he was walking up the stairs today when he started to have some chest tightness. Pt at this time states he was also having tightness in his throat. Pt states at no time did he become short of breath. Symptoms have mostly subsided at this time states it still just doesn't feel right.      Genelle Bal, RN  06/07/15 1932

## 2015-06-07 NOTE — ED Notes (Signed)
Nitro paste applied to left chest.      Genelle Bal, RN  06/07/15 2137

## 2015-06-07 NOTE — Progress Notes (Signed)
Dr. Raenette Rover notified of admission and 4 beats Vtach. New orders placed.

## 2015-06-07 NOTE — ED Provider Notes (Signed)
History of Present Illness:  06/07/15, Time: 8:00 PM.       Walter Hale is a 70 y.o. male presenting to the ED for CP, beginning one hour pta. The complaint has been persistent, moderate in severity, and worsened by nothing.  The patient's history is provided by the patient. He was brought to the ED by his wife.The patient presented to the ED due to CP. While going up the steps, he felt tightness in his chest and throat, he noted that the pain was not sharp. He has not experienced these symptoms in the past. He described the pain as a 5 out of 10 initially, and a 4/10 currently. The patient wife stated that he felt clammy.Pt denies cold or flu symptoms, HA, V/N/D, abdominal pain, diaphoresis, urinary sx, or any other symptoms at this time. No past medical history on file. His PCP is Dr. Lennice Sites.       Review of Systems:   Pertinent items noted in HPI.  Unless otherwise stated in this report or unable to obtain because of the patient's clinical or mental status as evidenced by the medical record, this patients's positive and negative responses for Review of Systems, constitutional, psych, eyes, ENT, cardiovascular, respiratory, gastrointestinal, neurological, genitourinary, musculoskeletal, integument systems and systems related to the presenting problem are either stated in the preceding or were not pertinent or were negative for the symptoms and/or complaints related to the medical problem.      --------------------------------------------- PAST HISTORY ---------------------------------------------  Past Medical History:  has no past medical history on file.    Past Surgical History:  has no past surgical history on file.    Social History:  reports that he has never smoked. He does not have any smokeless tobacco history on file. He reports that he does not drink alcohol or use illicit drugs.    Family History: family history is not on file.     The patient???s home medications have been reviewed.    Allergies:  Review of patient's allergies indicates no known allergies.        ---------------------------------------------------PHYSICAL EXAM--------------------------------------  Constitutional/General: Alert and oriented x3, well appearing, non toxic in NAD  Head: Normocephalic and atraumatic  Eyes: PERRL, EOMI  Mouth: Oropharynx clear, handling secretions, no trismus  Neck: Supple, full ROM, non tender to palpation in the midline, no stridor, no crepitus, no meningeal signs  Pulmonary: Lungs clear to auscultation bilaterally, no wheezes, rales, or rhonchi. Not in respiratory distress  Cardiovascular:  Regular rate. Regular rhythm. No murmurs, gallops, or rubs. 2+ distal pulses  Chest: no chest wall tenderness  Abdomen: Soft.  Non tender. Non distended.  +BS.  No rebound, guarding, or rigidity. No pulsatile masses appreciated.  Musculoskeletal: Moves all extremities x 4. Warm and well perfused, no clubbing, cyanosis, or edema. Capillary refill <3 seconds  Skin: warm and dry. No rashes.   Neurologic: GCS 15, CN 2-12 grossly intact, no focal deficits, symmetric strength 5/5 in the upper and lower extremities bilaterally          -------------------------------------------------- RESULTS -------------------------------------------------    LABS:  Results for orders placed or performed during the hospital encounter of 06/07/15   CBC Auto Differential   Result Value Ref Range    WBC 9.6 4.5 - 11.5 E9/L    RBC 5.41 3.80 - 5.80 E12/L    Hemoglobin 15.6 12.5 - 16.5 g/dL    Hematocrit 47.5 37.0 - 54.0 %    MCV 87.8 80.0 - 99.9 fL  MCH 28.8 26.0 - 35.0 pg    MCHC 32.8 32.0 - 34.5 %    RDW 13.7 11.5 - 15.0 fL    Platelets 273 130 - 450 E9/L    MPV 10.4 7.0 - 12.0 fL    Neutrophils % 60.8 43.0 - 80.0 %    Lymphocytes Relative 27.8 20.0 - 42.0 %    Monocytes % 7.7 2.0 - 12.0 %    Eosinophils Relative Percent 2.5 0.0 - 6.0 %    Basophils % 0.8 0.0 - 2.0 %    Neutrophils # 5.83 1.80 - 7.30 E9/L    Lymphocytes # 2.67 1.50 - 4.00 E9/L     Monocytes # 0.74 0.10 - 0.95 E9/L    Eosinophils # 0.24 0.05 - 0.50 E9/L    Basophils # 0.08 0.00 - 0.20 E9/L    Immature Granulocytes % 0.4 0.0 - 5.0 %    Immature Granulocytes # 0.04 E9/L   Comprehensive Metabolic Panel   Result Value Ref Range    Sodium 140 132 - 146 mmol/L    Potassium 4.2 3.5 - 5.0 mmol/L    Chloride 102 98 - 107 mmol/L    CO2 25 22 - 29 mmol/L    Anion Gap 13 7 - 16 mmol/L    Glucose 136 (H) 74 - 109 mg/dL    BUN 29 (H) 8 - 23 mg/dL    CREATININE 0.8 0.7 - 1.2 mg/dL    GFR Non-African American >60 >=60 mL/min/1.73    GFR African American >60     Calcium 9.8 8.6 - 10.2 mg/dL    Total Protein 6.9 6.4 - 8.3 g/dL    Alb 4.4 3.5 - 5.2 g/dL    Total Bilirubin 0.3 0.0 - 1.2 mg/dL    Alkaline Phosphatase 91 40 - 129 U/L    ALT 24 0 - 40 U/L    AST 23 0 - 39 U/L   Troponin   Result Value Ref Range    Troponin <0.01 0.00 - 0.03 ng/mL   CK   Result Value Ref Range    Total CK 116 20 - 200 U/L   Brain Natriuretic Peptide   Result Value Ref Range    Pro-BNP 90 0 - 125 pg/mL   Magnesium   Result Value Ref Range    Magnesium 2.3 1.6 - 2.6 mg/dL   APTT   Result Value Ref Range    aPTT 29.9 24.5 - 35.1 sec   Protime-INR   Result Value Ref Range    Protime 12.3 9.3 - 12.4 sec    INR 1.1    CK MB   Result Value Ref Range    CK-MB 7.2 0.0 - 7.7 ng/mL       RADIOLOGY:  Interpreted by Radiologist.  XR Chest Portable   Final Result   No radiographic evidence of acute cardiopulmonary disease.             EKG:  This EKG is signed and interpreted by the EP.    Time: 19:21  Rate: 75  Rhythm: Sinus  Interpretation: Sinus rhythm with frequent premature ventricular complexes; left anterior fascicular block  Comparison: no previous EKG available      ------------------------- NURSING NOTES AND VITALS REVIEWED ---------------------------   The nursing notes within the ED encounter and vital signs as below have been reviewed.  Visit Vitals   ??? BP (!) 176/96   ??? Pulse 71   ??? Temp 98.2 ??F (36.8 ??C) (  Oral)   ??? Resp 16   ??? Ht 5'  11" (1.803 m)   ??? Wt 173 lb (78.5 kg)   ??? SpO2 98%   ??? BMI 24.13 kg/m2     Oxygen Saturation Interpretation: Normal    The patient???s available past medical records and past encounters were reviewed.        ------------------------------ ED COURSE/MEDICAL DECISION MAKING----------------------  Medications   nitroglycerin (NITRO-BID) 2 % ointment 0.5 inch (not administered)   nitroGLYCERIN (NITROSTAT) SL tablet 0.4 mg (0.4 mg Sublingual Given 06/07/15 1948)         Medical Decision Making:    Pt presents with exertional type cp, ecg shows no ischemic changes, pt had aspirin, given nitro sl nitro paste, d/w internal med will admit for cardiac enzymes and cardiac eval    This patient's ED course included: a personal history and physicial examination, re-evaluation prior to disposition and multiple bedside re-evaluations    This patient has remained hemodynamically stable and rimproved during their ED course.    Re-Evaluations:           9:07 PM      Consultations:             D//w dr Raenette Rover will admit            Counseling:   The emergency provider has spoken with the patient and discussed today???s results, in addition to providing specific details for the plan of care and counseling regarding the diagnosis and prognosis.  Questions are answered at this time and they are agreeable with the plan.       --------------------------------- IMPRESSION AND DISPOSITION ---------------------------------    IMPRESSION  1. Chest pain, unspecified type    2. Premature atrial contraction        DISPOSITION  Disposition: Admit to telemetry  Patient condition is fair    06/07/15, 8:00 PM.    This note is prepared by Maylon Peppers, acting as Scribe for Joycelyn Das, [place], DO:  The scribe's documentation has been prepared under my direction and personally reviewed by me in its entirety.  I confirm that the note above accurately reflects all work, treatment, procedures, and medical decision making performed by  me.              Joycelyn Das, DO  06/07/15 2107

## 2015-06-07 NOTE — ED Notes (Signed)
SBAR report faxed to 4th floor.  Verified receipt with floor.  Tele pak requested.     Candyce Churn, RN  06/07/15 2152

## 2015-06-07 NOTE — Progress Notes (Signed)
Dr. Heber Carolina paged for new consult. Dr. Shawnie Pons on call. Awaiting return call.

## 2015-06-08 ENCOUNTER — Encounter: Primary: Family Medicine

## 2015-06-08 ENCOUNTER — Inpatient Hospital Stay
Admission: EM | Admit: 2015-06-08 | Discharge: 2015-06-09 | Disposition: A | Discharge status: Other Acute Inpatient Hospital | Source: Home / Self Care | Admitting: Internal Medicine

## 2015-06-08 ENCOUNTER — Observation Stay: Admit: 2015-06-08 | Primary: Family Medicine

## 2015-06-08 DIAGNOSIS — R079 Chest pain, unspecified: Principal | ICD-10-CM

## 2015-06-08 LAB — CBC WITH AUTO DIFFERENTIAL
Basophils %: 0.8 % (ref 0.0–2.0)
Basophils Absolute: 0.08 E9/L (ref 0.00–0.20)
Eosinophils %: 2.5 % (ref 0.0–6.0)
Eosinophils Absolute: 0.24 E9/L (ref 0.05–0.50)
Hematocrit: 47.5 % (ref 37.0–54.0)
Hemoglobin: 15.6 g/dL (ref 12.5–16.5)
Immature Granulocytes #: 0.04 E9/L
Immature Granulocytes %: 0.4 % (ref 0.0–5.0)
Lymphocytes %: 27.8 % (ref 20.0–42.0)
Lymphocytes Absolute: 2.67 E9/L (ref 1.50–4.00)
MCH: 28.8 pg (ref 26.0–35.0)
MCHC: 32.8 % (ref 32.0–34.5)
MCV: 87.8 fL (ref 80.0–99.9)
MPV: 10.4 fL (ref 7.0–12.0)
Monocytes %: 7.7 % (ref 2.0–12.0)
Monocytes Absolute: 0.74 E9/L (ref 0.10–0.95)
Neutrophils %: 60.8 % (ref 43.0–80.0)
Neutrophils Absolute: 5.83 E9/L (ref 1.80–7.30)
Platelets: 273 E9/L (ref 130–450)
RBC: 5.41 E12/L (ref 3.80–5.80)
RDW: 13.7 fL (ref 11.5–15.0)
WBC: 9.6 E9/L (ref 4.5–11.5)

## 2015-06-08 LAB — COMPREHENSIVE METABOLIC PANEL
ALT: 24 U/L (ref 0–40)
AST: 23 U/L (ref 0–39)
Albumin: 4.4 g/dL (ref 3.5–5.2)
Alkaline Phosphatase: 91 U/L (ref 40–129)
Anion Gap: 13 mmol/L (ref 7–16)
BUN: 29 mg/dL — ABNORMAL HIGH (ref 8–23)
CO2: 25 mmol/L (ref 22–29)
Calcium: 9.8 mg/dL (ref 8.6–10.2)
Chloride: 102 mmol/L (ref 98–107)
Creatinine: 0.8 mg/dL (ref 0.7–1.2)
GFR African American: >60
GFR Non-African American: >60 mL/min/{1.73_m2} (ref >=60)
Glucose: 136 mg/dL — ABNORMAL HIGH (ref 74–109)
Potassium: 4.2 mmol/L (ref 3.5–5.0)
Sodium: 140 mmol/L (ref 132–146)
Total Bilirubin: 0.3 mg/dL (ref 0.0–1.2)
Total Protein: 6.9 g/dL (ref 6.4–8.3)

## 2015-06-08 LAB — TROPONIN
Troponin: <0.01 ng/mL (ref 0.00–0.03)
Troponin: 0.28 ng/mL — ABNORMAL HIGH (ref 0.00–0.03)
Troponin: 0.24 ng/mL — ABNORMAL HIGH (ref 0.00–0.03)
Troponin: 0.17 ng/mL — ABNORMAL HIGH (ref 0.00–0.03)

## 2015-06-08 LAB — EKG 12-LEAD
Atrial Rate: 75 {beats}/min
P Axis: 57 degrees
P-R Interval: 154 ms
Q-T Interval: 384 ms
QRS Duration: 98 ms
QTc Calculation (Bazett): 428 ms
R Axis: -56 degrees
T Axis: 64 degrees
Ventricular Rate: 75 {beats}/min

## 2015-06-08 LAB — LIPID PANEL
Cholesterol, Total: 234 mg/dL — ABNORMAL HIGH (ref 0–199)
HDL: 48 mg/dL (ref >40)
LDL Calculated: 171 mg/dL — ABNORMAL HIGH (ref 0–99)
Triglycerides: 73 mg/dL (ref 0–149)
VLDL Cholesterol Calculated: 15 mg/dL

## 2015-06-08 LAB — MAGNESIUM: Magnesium: 2.3 mg/dL (ref 1.6–2.6)

## 2015-06-08 LAB — NM MYOCARDIAL SPECT REST MULTI: Left Ventricular Ejection Fraction: 54

## 2015-06-08 LAB — CK-MB: CK-MB: 7.2 ng/mL (ref 0.0–7.7)

## 2015-06-08 LAB — PROTIME-INR
INR: 1.1
Protime: 12.3 s (ref 9.3–12.4)

## 2015-06-08 LAB — D-DIMER, QUANTITATIVE: D-Dimer, Quant: <200 ng/mL DDU

## 2015-06-08 LAB — APTT: aPTT: 29.9 s (ref 24.5–35.1)

## 2015-06-08 LAB — CK: Total CK: 116 U/L (ref 20–200)

## 2015-06-08 LAB — BRAIN NATRIURETIC PEPTIDE: Pro-BNP: 90 pg/mL (ref 0–125)

## 2015-06-08 MED ORDER — METOPROLOL TARTRATE 25 MG PO TABS
25 MG | Freq: Two times a day (BID) | ORAL | Status: DC
Start: 2015-06-08 — End: 2015-06-09
  Administered 2015-06-08 – 2015-06-09 (×2): via ORAL

## 2015-06-08 MED ORDER — ACETAMINOPHEN 325 MG PO TABS
325 MG | ORAL | Status: DC | PRN
Start: 2015-06-08 — End: 2015-06-09
  Administered 2015-06-08: 22:00:00 650 mg via ORAL

## 2015-06-08 MED ORDER — NITROGLYCERIN 0.4 MG SL SUBL
0.4 MG | Freq: Once | SUBLINGUAL | Status: AC
Start: 2015-06-08 — End: 2015-06-07
  Administered 2015-06-08: 01:00:00 0.4 mg via SUBLINGUAL

## 2015-06-08 MED ORDER — HYDROCODONE-ACETAMINOPHEN 5-325 MG PO TABS
5-325 MG | ORAL | Status: DC | PRN
Start: 2015-06-08 — End: 2015-06-09

## 2015-06-08 MED ORDER — NITROGLYCERIN 2 % TD OINT
2 % | Freq: Once | TRANSDERMAL | Status: AC
Start: 2015-06-08 — End: 2015-06-07
  Administered 2015-06-08: 03:00:00 0.5 [in_us] via TOPICAL

## 2015-06-08 MED ORDER — TECHNETIUM TC 99M SESTAMIBI - CARDIOLITE
Freq: Once | Status: AC | PRN
Start: 2015-06-08 — End: 2015-06-08
  Administered 2015-06-08: 16:00:00 via INTRAVENOUS

## 2015-06-08 MED ORDER — METOPROLOL TARTRATE 25 MG PO TABS
25 MG | ORAL_TABLET | Freq: Two times a day (BID) | ORAL | 0 refills | Status: AC
Start: 2015-06-08 — End: ?

## 2015-06-08 MED ORDER — NORMAL SALINE FLUSH 0.9 % IV SOLN
0.9 % | Freq: Two times a day (BID) | INTRAVENOUS | Status: DC
Start: 2015-06-08 — End: 2015-06-09
  Administered 2015-06-08 – 2015-06-09 (×3): 10 mL via INTRAVENOUS

## 2015-06-08 MED ORDER — ATORVASTATIN CALCIUM 40 MG PO TABS
40 MG | Freq: Every evening | ORAL | Status: DC
Start: 2015-06-08 — End: 2015-06-09
  Administered 2015-06-09: 02:00:00 via ORAL

## 2015-06-08 MED ORDER — ATORVASTATIN CALCIUM 40 MG PO TABS
40 MG | ORAL_TABLET | Freq: Every evening | ORAL | 0 refills | Status: AC
Start: 2015-06-08 — End: ?

## 2015-06-08 MED ORDER — MORPHINE SULFATE (PF) 2 MG/ML IV SOLN
2 MG/ML | INTRAVENOUS | Status: DC | PRN
Start: 2015-06-08 — End: 2015-06-09

## 2015-06-08 MED ORDER — DEXTROSE-NACL 5-0.9 % IV SOLN
INTRAVENOUS | Status: DC
Start: 2015-06-08 — End: 2015-06-09
  Administered 2015-06-09 (×2): via INTRAVENOUS

## 2015-06-08 MED ORDER — REGADENOSON 0.4 MG/5ML IV SOLN
0.4 MG/5ML | Freq: Once | INTRAVENOUS | Status: AC
Start: 2015-06-08 — End: 2015-06-08
  Administered 2015-06-08: 16:00:00 via INTRAVENOUS

## 2015-06-08 MED ORDER — ASPIRIN 81 MG PO TBEC
81 MG | Freq: Every day | ORAL | Status: DC
Start: 2015-06-08 — End: 2015-06-09
  Administered 2015-06-08: 13:00:00 via ORAL

## 2015-06-08 MED ORDER — ASPIRIN 81 MG PO TBEC
81 MG | ORAL_TABLET | Freq: Every day | ORAL | 3 refills | Status: AC
Start: 2015-06-08 — End: ?

## 2015-06-08 MED ORDER — ENOXAPARIN SODIUM 40 MG/0.4ML SC SOLN
40 MG/0.4ML | Freq: Every day | SUBCUTANEOUS | Status: DC
Start: 2015-06-08 — End: 2015-06-09
  Administered 2015-06-08: 13:00:00 via SUBCUTANEOUS

## 2015-06-08 MED ORDER — NITROGLYCERIN 0.4 MG SL SUBL
0.4 MG | SUBLINGUAL | Status: DC | PRN
Start: 2015-06-08 — End: 2015-06-09

## 2015-06-08 MED ORDER — TECHNETIUM TC 99M SESTAMIBI - CARDIOLITE
Freq: Once | Status: AC | PRN
Start: 2015-06-08 — End: 2015-06-08
  Administered 2015-06-08: 14:00:00 via INTRAVENOUS

## 2015-06-08 MED ORDER — NITROGLYCERIN 0.4 MG SL SUBL
0.4 MG | ORAL_TABLET | SUBLINGUAL | 0 refills | Status: AC
Start: 2015-06-08 — End: ?

## 2015-06-08 MED ORDER — NORMAL SALINE FLUSH 0.9 % IV SOLN
0.9 % | INTRAVENOUS | Status: DC | PRN
Start: 2015-06-08 — End: 2015-06-09
  Administered 2015-06-09: 05:00:00 10 mL via INTRAVENOUS

## 2015-06-08 MED FILL — LIPITOR 40 MG PO TABS: 40 MG | ORAL | Qty: 1

## 2015-06-08 MED FILL — MONOJECT FLUSH SYRINGE 0.9 % IV SOLN: 0.9 % | INTRAVENOUS | Qty: 10

## 2015-06-08 MED FILL — METOPROLOL TARTRATE 25 MG PO TABS: 25 MG | ORAL | Qty: 1

## 2015-06-08 MED FILL — ASPIRIN EC 81 MG PO TBEC: 81 MG | ORAL | Qty: 1

## 2015-06-08 MED FILL — LOVENOX 40 MG/0.4ML SC SOLN: 40 MG/0.4ML | SUBCUTANEOUS | Qty: 0.4

## 2015-06-08 MED FILL — NITRO-BID 2 % TD OINT: 2 % | TRANSDERMAL | Qty: 1

## 2015-06-08 MED FILL — MAPAP 325 MG PO TABS: 325 MG | ORAL | Qty: 2

## 2015-06-08 MED FILL — NITROSTAT 0.4 MG SL SUBL: 0.4 MG | SUBLINGUAL | Qty: 25

## 2015-06-08 MED FILL — LEXISCAN 0.4 MG/5ML IV SOLN: 0.4 MG/5ML | INTRAVENOUS | Qty: 5

## 2015-06-08 MED FILL — MONOJECT FLUSH SYRINGE 0.9 % IV SOLN: 0.9 % | INTRAVENOUS | Qty: 20

## 2015-06-08 NOTE — Other (Addendum)
Patient Acct Nbr:  [id number]  Primary AUTH/CERT:    [place] Name:   Ge[employer] data [employer] Insurance Plan Name:  [place]  Primary Insurance Group Number:  [id number]  Primary Insurance Plan Type: C  Primary Insurance Policy Number:  [id number]

## 2015-06-08 NOTE — Progress Notes (Signed)
Dr Shawnie Pons notified of troponin.28, burst PAT rate 168 and burst afib RVR.

## 2015-06-08 NOTE — Progress Notes (Signed)
Dr Shawnie Pons notified of troponin 0.24, 4 beats V tach and burst afib RVR.

## 2015-06-08 NOTE — Progress Notes (Signed)
Lexiscan stress test complete   Nirvi Boehler, RN

## 2015-06-08 NOTE — Progress Notes (Signed)
Stress lab notified ok to proceed with stress test despite elevated troponin x2.

## 2015-06-08 NOTE — Progress Notes (Signed)
I called Dr Donneta Romberg office and spoke to  [place] at 1:25 PM  to schedule an follow up appointment for  Monday 1/16 at 2:15pm       [facility]  RN noified  Lolita Patella 06/08/2015 1:27 PM       Patient has now been scheduled for a cath 1/13

## 2015-06-08 NOTE — Progress Notes (Signed)
Pt offered tablet to use and is refusing at this time.

## 2015-06-08 NOTE — Progress Notes (Signed)
Report called to Jenny Reichmann with mobile intensive re: heart cath for tomorrow. He confirmed that he received the facesheet that was faxed earlier.

## 2015-06-08 NOTE — Progress Notes (Signed)
Discussed stress test results and recommendations from cardiology with patient at length.  Advised him that symptoms/abnormal labs are concerning, despite negative stress test for acute ischemia.  The patient is requesting to proceed with heart cath for further diagnostic evaluation prior to discharge.  I agree this is the best option.  Will notify cardiology and have it set up for tomorrow AM.    Electronically signed by Obie Dredge, MD on 06/08/15 at 1:23 PM

## 2015-06-08 NOTE — Progress Notes (Signed)
Walter Hale, clinical manager notified of nuclear exercise stress test tonight.

## 2015-06-08 NOTE — Progress Notes (Signed)
Spoke to Dr Heber Carolina regarding heart cath for tomorrow, he will write orders shortly

## 2015-06-08 NOTE — H&P (Signed)
[facility]  History and Physical      CHIEF COMPLAINT:  Chest pain    History of Present Illness: the patient is a 70 y.o. white man followed by Dr Lennice Sites who presents secondary to chest pain.  He was in his usual state of health until yesterday afternoon.  While walking up stairs, he developed a pressure in his chest radiating to the throat.  Prior to that, he reported doing a short repetition of weight lifting, and earlier in the day had lifted heavy trash bags into a garbage can.  He had diaphoresis but no nausea, dyspnea, or vomiting.  He sat down, but the discomfort did not fully go away.  He ingested 4 baby aspirin and presented to the ER.  Work up in the ER with EKG demonstrated non specific changes.  His first set of cardiac enzymes was negative, but 2nd set did elevate above normal.  He was admitted for further care.  After further consultation with cardiology, the decision was made to have a stress test.  Nuclear portion of the stress test was negative for acute ischemia.  The patient feels well at present and denies any current discomfort.  He is anxious to go home.  He denies any prior history of CAD, hypertension, arrhythmia, diabetes or pulmonary disease.        Past Medical History   Diagnosis Date   ??? Carpal tunnel syndrome    ??? Osteomyelitis of the thumb          Past Surgical History   Procedure Laterality Date   ??? Finger amputation       L thumb tip amputated from infection under nail   ??? Leg surgery  1971     plate placed in L leg d/t work accident- plate removed in [id number]       Medications Prior to Admission:    No prescriptions prior to admission.    Allergies:    Review of patient's allergies indicates no known allergies.    Social History:    reports that he has never smoked. He does not have any smokeless tobacco history on file. He reports that he does not drink alcohol or use illicit drugs.    Family History:   History of diabetes on his father's side.  No history of  early CAD.    REVIEW OF SYSTEMS:  As above in the HPI, otherwise negative    PHYSICAL EXAM:    Vitals:  Visit Vitals   ??? BP 133/80   ??? Pulse 55   ??? Temp 97.4 ??F (36.3 ??C) (Oral)   ??? Resp 18   ??? Ht 5\' 11"  (1.803 m)   ??? Wt 164 lb (74.4 kg)   ??? SpO2 95%   ??? BMI 22.87 kg/m2       General:  Awake, alert, oriented X 3.  Well developed, well nourished, well groomed.  No apparent distress.  HEENT:  Normocephalic, atraumatic.  No scleral icterus.  No conjunctival injection.  Normal lips, teeth, and gums.  No nasal discharge.  Neck:  Supple  Heart:  RRR, no murmurs, gallops, or rubs  Lungs:  CTA bilaterally, bilat symmetrical expansion, no wheeze, rales, or rhonchi  Abdomen:  Bowel sounds present, soft, non distended, nontender, no masses, no organomegaly, no peritoneal signs  Extremities:  No clubbing, cyanosis, or edema  Skin:  Warm and dry, no open lesions or rash  Neuro:  Cranial nerves 2-12 intact, no focal deficits  Breast:  deferred  Rectal: deferred  Genitalia:  deferred    LABS:  CBC:   Lab Results   Component Value Date    WBC 9.6 06/07/2015    RBC 5.41 06/07/2015    HGB 15.6 06/07/2015    HCT 47.5 06/07/2015    MCV 87.8 06/07/2015    MCH 28.8 06/07/2015    MCHC 32.8 06/07/2015    RDW 13.7 06/07/2015    PLT 273 06/07/2015    MPV 10.4 06/07/2015     BMP:    Lab Results   Component Value Date    NA 140 06/07/2015    K 4.2 06/07/2015    CL 102 06/07/2015    CO2 25 06/07/2015    BUN 29 06/07/2015    LABALBU 4.4 06/07/2015    CREATININE 0.8 06/07/2015    CALCIUM 9.8 06/07/2015    GFRAA >60 06/07/2015    LABGLOM >60 06/07/2015    GLUCOSE 136 06/07/2015     PT/INR:    Lab Results   Component Value Date    PROTIME 12.3 06/07/2015    INR 1.1 06/07/2015     PTT:    Lab Results   Component Value Date    APTT 29.9 06/07/2015   [APTT}  Last 3 Troponin:    Lab Results   Component Value Date    TROPONINI 0.24 06/08/2015    TROPONINI 0.28 06/08/2015    TROPONINI <0.01 06/07/2015     FLP:    Lab Results   Component Value Date    TRIG  73 06/08/2015    HDL 48 06/08/2015    LDLCALC 171 06/08/2015    LABVLDL 15 06/08/2015       EKG reviewed      ASSESSMENT:      Patient Active Problem List   Diagnosis   ??? Chest pain-rule out acute coronary syndrome   ??? Mixed hyperlipidemia       PLAN:    1) admit observation to the chest pain unit to work up the cause of chest pain  2) cycle enzymes (done)  3) check stress test (done)  4) as noted, stress test negative but symptoms and mild enzyme elevation concerning for coronary disease.  I have discussed the case with cardiology in detail.  They would like patient to start aspirin, beta blocker and statin.  They recommend ambulating the patient.  If any chest pain/shortness of breath, then hold discharge and proceed with cath tomorrow AM.  If asymptomatic, then ok to discharge to home with medical regimen as indicated, but with instructions to return to the ER immediately for any further chest pain.  5) the patient is expected to stay less than 2 or more midnights to evaluate and treat his chest pain      Please note that over 50 minutes was spent in evaluating the patient, review of records and results, discussion with staff/family, etc.    Obie Dredge  MD  1:06 PM  06/08/2015

## 2015-06-08 NOTE — Consults (Signed)
CARDIOLOGY CONSULTATION    Patient Name:  Walter Hale    DOB:  Oct 21, 1945    Reason for Consultation:   Chest discomfort    History of Present Illness:   Walter Hale presents to [facility], following a history of sudden onset of retrosternal chest discomfort radiating into his left neck associated with diaphoresis approximate 7 PM last evening.  As his discomfort did not resolve he came to the emergency room for further evaluation.he described as feeling in his neck as a choking sensation and took 4 aspirin and subsequent became to the emergency room for further evaluation as mentioned above.  He has no previous history of coronary artery disease nor cardiac events.  Upon admission his serum troponin levels were minimally elevated but did not significantly rise.  He is now admitted for further observation and diagnostic testing and potential intervention.  He notes that his cholesterol recently obtained was minimally elevated and was offered statins but turned him down hoping that simple dietary change would make a significant difference.  He denies smoking.  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 Dena Billet.    Past Medical History:   has a past medical history of Carpal tunnel syndrome and H/O cardiovascular stress test.    Surgical History:   has a past surgical history that includes Finger amputation and Leg Surgery (1971).     Social History:   reports that he has never smoked. He does not have any smokeless tobacco history on file. He reports that he does not drink alcohol or use illicit drugs.     Family History:  family history is remarkable in that both parents are deceased secondary to cancer.    Medications:  Prior to Admission medications    Not on File       Allergies:  Review of patient's allergies indicates no known allergies.     Review of Systems:   ?? Constitutional: there has been no unanticipated weight  loss. There's been no change in energy level, sleep pattern or activity level. No fever chills or rigors.    ?? Eyes: No visual changes or diplopia. No scleral icterus.  ?? ENT: No Headaches, hearing loss or vertigo. No mouth sores or sore throat. No change in taste or smell.  ?? Cardiovascular: 8ad chest discomfort,diaphoresis, dyspnea on exertion,no significant palpitations, loss of consciousness, no phlebitis, no claudication.  ?? Respiratory: No cough or wheezing, no sputum production. No hemoptysis, pleuritic pain.  ?? Gastrointestinal: No abdominal pain, appetite loss, blood in stools. No change in bowel habits. No hematemesis  ?? Genitourinary: No dysuria, trouble voiding or hematuria. No nocturia or increased frequency.  ?? Musculoskeletal:  No gait disturbance, weakness or joint complaints.  ?? Integumentary: No rash or pruritis.  ?? Neurological: No headache, diplopia, change in muscle strength, numbness or tingling. No change in gait, balance, coordination, mood, affect, memory, mentation, behavior.  ?? Psychiatric: No anxiety or depression.  ?? Endocrine: No temperature intolerance. No excessive thirst, fluid intake, or urination. No tremor.  ?? Hematologic/Lymphatic: No abnormal bruising or bleeding, blood clots or swollen lymph nodes.  ?? Allergic/Immunologic: No nasal congestion or hives.    Physical Examination:    Vital Signs:   Visit Vitals   ??? BP 133/80   ??? Pulse 55   ??? Temp 97.4 ??F (36.3 ??C) (Oral)   ??? Resp 18   ??? Ht  5\' 11"  (1.803 m)   ??? Wt 164 lb (74.4 kg)   ??? SpO2 95%   ??? BMI 22.87 kg/m2     General appearance: Well preserved, mesomorphic body habitus, alert, no distress.  Skin: Skin color, texture, turgor normal. No rashes or lesions. No induration or tightening palpated.  Head: Normocephalic. No masses, lesions, tenderness or abnormalities  Eyes: Conjunctivae/corneas clear. PERRL, EOMs intact. Sclera non icteric.  Ears: External ears normal. Canals clear. TM's clear bilaterally. Hearing normal to finger  rub.  Nose/Sinuses: Nares normal. Septum midline. Mucosa normal. No drainage or sinus tenderness.  Oropharynx: Lips, mucosa, and tongue normal. Oropharynx clear with no exudate seen.  Neck: Neck supple and symmetric. No adenopathy. Thyroid symmetric, normal size, without nodules. Trachea is midline. Carotids brisk in upstroke without bruits, no abnormal JVP noted at 45??.  Chest: Even excursion  Lungs: Lungs clear to auscultation bilaterally. No retractions or use of accessory muscles. No tactile vocal fremitus. No rhonchi, crackles or rales.  Heart:  S1 > S2.  Regular rate and rhythm. Soft S4 gallop or murmur. No rub, palpable thrill or heave noted. PMI 5th intercostal space midclavicular line.  Abdomen: Abdomen soft, mildly protuberant, non-tender. BS normal. No masses, organomegaly. No hernia noted.  Extremities: Extremities normal. No deformities, edema, or skin discoloration.  No cyanosis or clubbing noted to the nails. Peripheral pulses present 2+ upper extremities and present 2+  lower extremities.  Musculoskeletal: Spine ROM normal. Muscular strength intact.  Neuro: Cranial nerves intact. Motor: Strength 5/5 in all extremities. Reflexes 2+ in all extremities. No focal weakness. Sensory: grossly normal to touch. Coordination intact.    Pertinent Labs:  CBC:   Recent Labs      06/07/15   1945   WBC  9.6   HGB  15.6   PLT  273     BMP:  Recent Labs      06/07/15   1945   NA  140   K  4.2   CL  102   CO2  25   BUN  29*   CREATININE  0.8   GLUCOSE  136*   LABGLOM  >60     ABGs: No results found for: PH, PO2, PCO2  INR:   Recent Labs      06/07/15   1945   INR  1.1     PRO-BNP:   Lab Results   Component Value Date    PROBNP 90 06/07/2015      Cardiac Injury Profile:   Recent Labs      06/07/15   1945  06/08/15   0010  06/08/15   0545   CKTOTAL  116   --    --    CKMB  7.2   --    --    TROPONINI  <0.01  0.28*  0.24*      Lipid Profile:   Lab Results   Component Value Date    TRIG 73 06/08/2015    HDL 48 06/08/2015     LDLCALC 171 06/08/2015    CHOL 234 06/08/2015      Hemoglobin A1C: No components found for: HGBA1C   ECG:  See report    Radiology:  Xr Chest Portable    Result Date: 06/07/2015  Patient MRN: [id number] DOB: 01/12/1946 Age:  17 years Gender: Male Order Date: 06/07/2015 7:59 PM Exam: XR CHEST PORTABLE Number of Views: 1 Indication:    CHEST PAIN Comparison: None Findings: There is a normal  cardiomediastinal silhouette with clear lungs. No pneumothorax, pleural effusion or focal areas of airspace consolidation.     No radiographic evidence of acute cardiopulmonary disease.     Assessment:    Principal Problem:    Acute coronary syndrome [facility])  Active Problems:    Mixed hyperlipidemia      Plan:  Based upon Walter Hale clinical presentation and his electrocardiographic abnormalities a nuclear stress test has been obtained which she did experience mild recurrent symptoms which resolved within 4 minutes following injection of regadenoson.  We are now awaiting the results of his nuclear imaging and if abnormal will proceed with further diagnostic studies.  I've also urged Walter Hale to heat the recommendations of Dr. Dena Billet and be placed on a statin in addition to dietary cholesterol restriction.    I have spent more than 45 minutes face to face with Walter Hale reviewing notes and laboratory data with greater than 50% of this time instructing and counseling the patient  regarding my findings and recommendations and I have answered all questions as posed to me by Walter Hale. Thank you, No primary care provider on file. for allowing me to consult in the care of this patient.    Janine Ores DO, FACP, FACC, FSCAI    NOTE:  This report was transcribed using voice recognition software.  Every effort was made to ensure accuracy; however, inadvertent computerized transcription errors may be present.

## 2015-06-08 NOTE — Plan of Care (Signed)
Problem: Pain:  Goal: Pain level will decrease  Pain level will decrease   Outcome: Met This Shift    Problem: Safety:  Goal: Free from accidental physical injury  Free from accidental physical injury   Outcome: Met This Shift    Problem: Discharge Planning:  Goal: Patients continuum of care needs are met  Patients continuum of care needs are met   Outcome: Ongoing

## 2015-06-09 ENCOUNTER — Inpatient Hospital Stay: Attending: Cardiovascular Disease | Primary: Family Medicine

## 2015-06-09 DIAGNOSIS — I214 Non-ST elevation (NSTEMI) myocardial infarction: Secondary | ICD-10-CM

## 2015-06-09 LAB — URINALYSIS
Bilirubin Urine: NEGATIVE
Blood, Urine: NEGATIVE
Glucose, Ur: NEGATIVE mg/dL
Ketones, Urine: NEGATIVE mg/dL
Leukocyte Esterase, Urine: NEGATIVE
Nitrite, Urine: NEGATIVE
Protein, UA: NEGATIVE mg/dL
Specific Gravity, UA: 1.015 (ref 1.005–1.030)
Urobilinogen, Urine: 0.2 E.U./dL (ref <2.0)
pH, UA: 6.5 (ref 5.0–9.0)

## 2015-06-09 LAB — CBC
Hematocrit: 49.9 % (ref 37.0–54.0)
Hemoglobin: 16.5 g/dL (ref 12.5–16.5)
MCH: 28.8 pg (ref 26.0–35.0)
MCHC: 33.1 % (ref 32.0–34.5)
MCV: 87.2 fL (ref 80.0–99.9)
MPV: 10.7 fL (ref 7.0–12.0)
Platelets: 246 E9/L (ref 130–450)
RBC: 5.72 E12/L (ref 3.80–5.80)
RDW: 13.7 fL (ref 11.5–15.0)
WBC: 7.1 E9/L (ref 4.5–11.5)

## 2015-06-09 LAB — PROTIME-INR
INR: 1.1
Protime: 12.3 s (ref 9.3–12.4)

## 2015-06-09 LAB — TYPE AND SCREEN
ABO/Rh: O POS
Antibody Screen: NEGATIVE

## 2015-06-09 LAB — POTASSIUM: Potassium: 4.4 mmol/L (ref 3.5–5.0)

## 2015-06-09 MED ORDER — NORMAL SALINE FLUSH 0.9 % IV SOLN
0.9 % | INTRAVENOUS | Status: DC | PRN
Start: 2015-06-09 — End: 2015-06-09

## 2015-06-09 MED ORDER — HEPARIN SODIUM (PORCINE) 10000 UNIT/ML IJ SOLN
10000 UNIT/ML | INTRAMUSCULAR | Status: AC
Start: 2015-06-09 — End: ?

## 2015-06-09 MED ORDER — MIDAZOLAM HCL 2 MG/2ML IJ SOLN
2 MG/ML | INTRAMUSCULAR | Status: AC
Start: 2015-06-09 — End: ?

## 2015-06-09 MED ORDER — FENTANYL CITRATE (PF) 100 MCG/2ML IJ SOLN
100 MCG/2ML | INTRAMUSCULAR | Status: AC
Start: 2015-06-09 — End: ?

## 2015-06-09 MED ORDER — NORMAL SALINE FLUSH 0.9 % IV SOLN
0.9 % | Freq: Two times a day (BID) | INTRAVENOUS | Status: DC
Start: 2015-06-09 — End: 2015-06-09

## 2015-06-09 MED ORDER — TICAGRELOR 90 MG PO TABS
90 MG | ORAL | Status: AC
Start: 2015-06-09 — End: ?

## 2015-06-09 MED ORDER — BIVALIRUDIN 250 MG IV SOLR
250 MG | INTRAVENOUS | Status: AC
Start: 2015-06-09 — End: ?

## 2015-06-09 MED ORDER — SODIUM CHLORIDE 0.9 % IV SOLN
0.9 % | INTRAVENOUS | Status: DC
Start: 2015-06-09 — End: 2015-06-09

## 2015-06-09 MED ORDER — LIDOCAINE HCL 2 % IJ SOLN
2 % | INTRAMUSCULAR | Status: AC
Start: 2015-06-09 — End: ?

## 2015-06-09 MED FILL — LIDOCAINE HCL 2 % IJ SOLN: 2 % | INTRAMUSCULAR | Qty: 20

## 2015-06-09 MED FILL — ANGIOMAX 250 MG IV SOLR: 250 MG | INTRAVENOUS | Qty: 250

## 2015-06-09 MED FILL — MONOJECT FLUSH SYRINGE 0.9 % IV SOLN: 0.9 % | INTRAVENOUS | Qty: 10

## 2015-06-09 MED FILL — ASPIRIN ADULT LOW STRENGTH 81 MG PO TBEC: 81 MG | ORAL | Qty: 1

## 2015-06-09 MED FILL — MIDAZOLAM HCL 2 MG/2ML IJ SOLN: 2 MG/ML | INTRAMUSCULAR | Qty: 2

## 2015-06-09 MED FILL — METOPROLOL TARTRATE 25 MG PO TABS: 25 MG | ORAL | Qty: 1

## 2015-06-09 MED FILL — FENTANYL CITRATE (PF) 100 MCG/2ML IJ SOLN: 100 MCG/2ML | INTRAMUSCULAR | Qty: 2

## 2015-06-09 MED FILL — BRILINTA 90 MG PO TABS: 90 MG | ORAL | Qty: 2

## 2015-06-09 MED FILL — LIPITOR 40 MG PO TABS: 40 MG | ORAL | Qty: 1

## 2015-06-09 MED FILL — HEPARIN SODIUM (PORCINE) 10000 UNIT/ML IJ SOLN: 10000 UNIT/ML | INTRAMUSCULAR | Qty: 1

## 2015-06-09 NOTE — Progress Notes (Signed)
Subjective:    The patient is awake and alert.  Feeling well.  No chest pain or burning.  No shortness of breath.  Awaiting cath this AM.    Objective:    Visit Vitals   ??? BP 128/67   ??? Pulse 51   ??? Temp 97.5 ??F (36.4 ??C) (Oral)   ??? Resp 16   ??? Ht 5\' 11"  (1.803 m)   ??? Wt 164 lb (74.4 kg)   ??? SpO2 96%   ??? BMI 22.87 kg/m2       Heart:  Regular rhythm, brady, no murmurs, gallops, or rubs.  Lungs:  CTA bilaterally, no wheeze, rales or rhonchi  Abd: bowel sounds present, nontender, nondistended, no masses  Extrem:  No clubbing, cyanosis, or edema    Last 3 Troponin:    Lab Results   Component Value Date    TROPONINI 0.17 06/08/2015    TROPONINI 0.24 06/08/2015    TROPONINI 0.28 06/08/2015        D dimer <200      Assessment:    Patient Active Problem List   Diagnosis   ??? Chest pain-rule out acute coronary syndrome   ??? Mixed hyperlipidemia       Plan:    1) for heart cath today  2) continue current medications  3) further management depends on results of cath (if negative, likely discharge later today; if intervention needed likely discharge over the weekend)      Obie Dredge  MD  8:53 AM  06/09/2015

## 2015-06-09 NOTE — Progress Notes (Signed)
Indication:    1. Acute coronary syndrome  2. Catheterization: AUC score 8 . Indication 3-8.  3. PCI: AUC score 8 Indication 10-8.     Procedure: Left Heart Catheterization, coronary angiography, left ventriculography, percutaneous coronary intervention to LAD,      Anesthesia: Versed, Fentanyl, 1 and 50  Time sedation was administered: 2 PM. I was present in the room when sedation was administered.   Procedure end time: 2:55 PM  Time spent with face to face monitoring of moderate sedation: 20 minutes    Cardiac cath performed via right femoral approach using 106F sheath.    Findings:   Left main: 0% stenosis  LAD: 85 %  proximal stenosis  Circumflex: <5%   stenosis.   RCA: Dominant.  <5 %  stenosis.   LV angio: 70%  ejection fraction      Hemodynamics:  Ao: 129/64 (86)  LV: 105/1  LVEDP: 1    Interventional procedure:   1.PCI vessel: LAD. Lesion type: 2B. TIMI 3 flow pre PCI.   Angioplasty performed with 2.5 x 12 balloon. Stenting performed with 3 x 16.      Drugs: .   Anticoagulation was maintained with Bivalirudin.   75 mcg load given  in cath lab.     There was good distal pulses in the RPT at the end of the procedure.    Complication: None   Blood loss: Less than 5 cc  Contrast used: 91 cc      Post op diagnosis:  Acute coronary syndrome    Plan:  DAPT  Risk profile modification.   See orders

## 2015-06-09 NOTE — Progress Notes (Signed)
24 hour chart check complete.

## 2015-06-09 NOTE — H&P (Signed)
History and Physical      CHIEF COMPLAINT:  Here for cardiac catheterization / PCI    History of Present Illness:  Walter Hale is a 70 y.o. year-old male presents for cardiac catheterization / PCI following a history of recurrent chest discomfort which he first noticed with exertion the other morning. Actually woke up with pressure in the left chest radiating into his neck. It took several minutes for relief and he took 4 aspirin and came to [facility] ER. His EKG was abnormal suggesting ischemia and he thus underwent a nuclear stress test, for which he had recurrent symptoms yet his imaging was interpreted as normal. He elected to remain in the hospital as, despite stress test findings, he does not feel well and continues to experience subtle sensation in his chest. For this reason he has agreed to undergo diagnostic cardiac cath with potential for intervention.         Past Medical History   Diagnosis Date   ??? Carpal tunnel syndrome    ??? H/O cardiovascular stress test 06/08/2015     Lexiscan   ??? History of blood transfusion 1971         Past Surgical History   Procedure Laterality Date   ??? Finger amputation       L thumb tip amputated from infection under nail   ??? Leg surgery  1971     plate placed in L leg d/t work accident- plate removed in QA348G   ??? Coronary angioplasty with stent placement  06/09/2015     Dr. Heber Carolina -3.0x16 Synergy DES to the Prox LAD.       Medications Prior to Admission:    Prior to Admission medications    Medication Sig Start Date End Date Taking? Authorizing Provider   aspirin 81 MG EC tablet Take 1 tablet by mouth daily 06/08/15   Obie Dredge, MD   nitroGLYCERIN (NITROSTAT) 0.4 MG SL tablet up to max of 3 total doses. If no relief after 1 dose, call 911.Place 1 tablet under the tongue upon chest pain, wait 5 minutes.  May repeat 2 more times every 5 minutes if necessary. 06/08/15   Obie Dredge, MD   atorvastatin (LIPITOR) 40 MG tablet Take 1 tablet by mouth nightly 06/08/15    Obie Dredge, MD   metoprolol tartrate (LOPRESSOR) 25 MG tablet Take 1 tablet by mouth 2 times daily 06/08/15   Obie Dredge, MD       Allergies:    Review of patient's allergies indicates no known allergies.    Social History:    reports that he has never smoked. He does not have any smokeless tobacco history on file. He reports that he does not drink alcohol or use illicit drugs.    Family History:   family history is not on file.    REVIEW OF SYSTEMS:  As above in the HPI, otherwise negative    PHYSICAL EXAM:    Vitals:  Visit Vitals   ??? BP (!) 136/91   ??? Pulse 57   ??? Temp 97.5 ??F (36.4 ??C) (Oral)   ??? Resp 18   ??? Ht 5\' 11"  (1.803 m)   ??? Wt 167 lb (75.8 kg)   ??? SpO2 95%   ??? BMI 23.29 kg/m2       General:  Awake, alert, oriented X 3.  Well developed, well nourished, well groomed.  No apparent distress.  HEENT:  Normocephalic, atraumatic.  Pupils equal, round,  reactive to light.  No scleral icterus.  No conjunctival injection.  Normal lips, teeth, and gums.  No nasal discharge.  Neck:  Supple No palpable cervical or supraclavicular nodes.Carotids brisk in upstroke , without carotid bruits. No abnormal JVP at 45??Marland KitchenThyroid not palpable.  Chest: Even excursion.  Heart:  Regular rate Regular rhythm, S1> S2 no murmurs, soft S4 gallop, or rubs. PMI 5th intercostal space midclavicular line.  Lungs: CTA bilaterally, bilat symmetrical expansion, no wheeze, rales, or rhonchi. No decreased tactile fremitus.  Abdomen: Bowel sounds present, soft, nontender, no masses, no organomegaly, no peritoneal signs  Extremities:  No clubbing, cyanosis, No edema PT present 2+  , DP present 2+   R  present 2+ bilaterally.  Skin: Warm and dry, no open lesions or rash  Neuro:  Cranial nerves 2-12 intact, no focal sensory or motor deficits  Breast: deferred  Rectal: deferred  Genitalia:  deferred    LABS:  CBC:   Lab Results   Component Value Date    WBC 7.1 06/09/2015    RBC 5.72 06/09/2015    HGB 16.5 06/09/2015    HCT 49.9 06/09/2015     MCV 87.2 06/09/2015    MCH 28.8 06/09/2015    MCHC 33.1 06/09/2015    RDW 13.7 06/09/2015    PLT 246 06/09/2015    MPV 10.7 06/09/2015     BMP:    Lab Results   Component Value Date    NA 140 06/07/2015    K 4.4 06/09/2015    CL 102 06/07/2015    CO2 25 06/07/2015    BUN 29 06/07/2015    LABALBU 4.4 06/07/2015    CREATININE 0.8 06/07/2015    CALCIUM 9.8 06/07/2015    GFRAA >60 06/07/2015    LABGLOM >60 06/07/2015    GLUCOSE 136 06/07/2015     PT/INR:    Lab Results   Component Value Date    PROTIME 12.3 06/09/2015    INR 1.1 06/09/2015         ASSESSMENT:      Patient Active Problem List   Diagnosis   ??? Mixed hyperlipidemia   ??? Chest pain       PLAN:  I have reviewed the indications, risks, and benefits of cardiac catheterization / PCI with Bartolo Darter, and the patient states he fully understands and agrees to proceed.  I have answered all questions posed to me by the patient.     SCAI-AUC A(8) Indication 3 ; Score 8.    Janine Ores, DO FACP,FACC,FSCAI  06/09/2015

## 2015-06-09 NOTE — Progress Notes (Signed)
Patient Left for Main for Cath 12:05 PM

## 2015-06-09 NOTE — Plan of Care (Signed)
Problem: Pain:  Goal: Patient???s pain/discomfort is manageable  Patient???s pain/discomfort is manageable   Outcome: Met This Shift    Problem: Safety:  Goal: Free from accidental physical injury  Free from accidental physical injury   Outcome: Met This Shift

## 2015-06-09 NOTE — Discharge Summary (Signed)
See H&P.  Patient discharged less than 48 hours from admission.  Presented with chest pain.  Stress test negative but enzymes modestly elevated.  Symptoms consistent with unstable angina.  Discharged to [facility] for further work up with heart cath.      Electronically signed by Obie Dredge, MD on 06/09/2015 at 3:43 PM

## 2015-06-09 NOTE — Progress Notes (Signed)
Rounded on patient, explained bedside tablet to patient by Brien Mates. 06/09/2015 0850am

## 2015-06-09 NOTE — Other (Signed)
Patient Acct Nbr:  [id number]  Primary AUTH/CERT:    [place] Name:   Geophysical data processor Insurance Plan Name:  [place]  Primary Insurance Group Number:  [id number]  Primary Insurance Plan Type: C  Primary Insurance Policy Number:  [id number]

## 2015-06-10 LAB — BASIC METABOLIC PANEL
Anion Gap: 14 mmol/L (ref 7–16)
BUN: 17 mg/dL (ref 8–23)
CO2: 25 mmol/L (ref 22–29)
Calcium: 9.9 mg/dL (ref 8.6–10.2)
Chloride: 99 mmol/L (ref 98–107)
Creatinine: 0.6 mg/dL — ABNORMAL LOW (ref 0.7–1.2)
GFR African American: >60
GFR Non-African American: >60 mL/min/{1.73_m2} (ref >=60)
Glucose: 101 mg/dL (ref 74–109)
Potassium: 4 mmol/L (ref 3.5–5.0)
Sodium: 138 mmol/L (ref 132–146)

## 2015-06-10 MED ORDER — PANTOPRAZOLE SODIUM 20 MG PO TBEC
20 MG | Freq: Every day | ORAL | Status: DC
Start: 2015-06-10 — End: 2015-06-10
  Administered 2015-06-10: 12:00:00 via ORAL

## 2015-06-10 MED ORDER — METOPROLOL TARTRATE 25 MG PO TABS
25 MG | ORAL_TABLET | Freq: Two times a day (BID) | ORAL | 3 refills | Status: AC
Start: 2015-06-10 — End: ?

## 2015-06-10 MED ORDER — NORMAL SALINE FLUSH 0.9 % IV SOLN
0.9 % | Freq: Two times a day (BID) | INTRAVENOUS | Status: DC
Start: 2015-06-10 — End: 2015-06-10
  Administered 2015-06-10: 03:00:00 via INTRAVENOUS

## 2015-06-10 MED ORDER — PANTOPRAZOLE SODIUM 20 MG PO TBEC
20 MG | ORAL_TABLET | Freq: Every day | ORAL | 3 refills | Status: AC
Start: 2015-06-10 — End: ?

## 2015-06-10 MED ORDER — ATORVASTATIN CALCIUM 40 MG PO TABS
40 MG | ORAL_TABLET | Freq: Every evening | ORAL | 3 refills | Status: AC
Start: 2015-06-10 — End: ?

## 2015-06-10 MED ORDER — ATORVASTATIN CALCIUM 40 MG PO TABS
40 MG | Freq: Every evening | ORAL | Status: DC
Start: 2015-06-10 — End: 2015-06-10
  Administered 2015-06-10: 03:00:00 via ORAL

## 2015-06-10 MED ORDER — ASPIRIN 81 MG PO CHEW
81 MG | Freq: Every day | ORAL | Status: DC
Start: 2015-06-10 — End: 2015-06-10
  Administered 2015-06-10: 14:00:00 via ORAL

## 2015-06-10 MED ORDER — NITROGLYCERIN 0.4 MG SL SUBL
0.4 MG | ORAL_TABLET | SUBLINGUAL | 3 refills | Status: AC
Start: 2015-06-10 — End: ?

## 2015-06-10 MED ORDER — TICAGRELOR 90 MG PO TABS
90 MG | Freq: Two times a day (BID) | ORAL | Status: DC
Start: 2015-06-10 — End: 2015-06-10
  Administered 2015-06-10: 14:00:00 via ORAL

## 2015-06-10 MED ORDER — NITROGLYCERIN 0.4 MG SL SUBL
0.4 MG | SUBLINGUAL | Status: DC | PRN
Start: 2015-06-10 — End: 2015-06-10

## 2015-06-10 MED ORDER — ONDANSETRON HCL 4 MG/2ML IJ SOLN
4 MG/2ML | Freq: Four times a day (QID) | INTRAMUSCULAR | Status: DC | PRN
Start: 2015-06-10 — End: 2015-06-10

## 2015-06-10 MED ORDER — ASPIRIN 81 MG PO TBEC
81 MG | Freq: Every day | ORAL | Status: DC
Start: 2015-06-10 — End: 2015-06-10

## 2015-06-10 MED ORDER — METOPROLOL TARTRATE 25 MG PO TABS
25 MG | Freq: Two times a day (BID) | ORAL | Status: DC
Start: 2015-06-10 — End: 2015-06-10

## 2015-06-10 MED ORDER — ACETAMINOPHEN 325 MG PO TABS
325 MG | ORAL | Status: DC | PRN
Start: 2015-06-10 — End: 2015-06-10

## 2015-06-10 MED ORDER — MAGNESIUM HYDROXIDE 400 MG/5ML PO SUSP
400 MG/5ML | Freq: Every day | ORAL | Status: DC | PRN
Start: 2015-06-10 — End: 2015-06-10

## 2015-06-10 MED ORDER — NORMAL SALINE FLUSH 0.9 % IV SOLN
0.9 % | INTRAVENOUS | Status: DC | PRN
Start: 2015-06-10 — End: 2015-06-10

## 2015-06-10 MED ORDER — ATORVASTATIN CALCIUM 40 MG PO TABS
40 MG | Freq: Every evening | ORAL | Status: DC
Start: 2015-06-10 — End: 2015-06-10

## 2015-06-10 MED ORDER — TICAGRELOR 90 MG PO TABS
90 MG | ORAL_TABLET | Freq: Two times a day (BID) | ORAL | 0 refills | Status: AC
Start: 2015-06-10 — End: ?

## 2015-06-10 MED FILL — PANTOPRAZOLE SODIUM 20 MG PO TBEC: 20 MG | ORAL | Qty: 1

## 2015-06-10 MED FILL — LIPITOR 40 MG PO TABS: 40 MG | ORAL | Qty: 1

## 2015-06-10 MED FILL — ASPIRIN LOW STRENGTH 81 MG PO CHEW: 81 MG | ORAL | Qty: 1

## 2015-06-10 MED FILL — BRILINTA 90 MG PO TABS: 90 MG | ORAL | Qty: 1

## 2015-06-10 NOTE — Discharge Summary (Signed)
See H&P.  Patient discharged less than 48 hours from admission.      Electronically signed by Obie Dredge, MD on 06/10/2015 at 10:04 AM

## 2015-06-10 NOTE — Progress Notes (Signed)
PROGRESS NOTE       PATIENT PROBLEM LIST:  Principal Problem:    NSTEMI (non-ST elevated myocardial infarction) ([place])  Active Problems:    Mixed hyperlipidemia    CAD in native artery    Status post insertion of drug eluting coronary artery stent      SUBJECTIVE:  Walter Hale states He is fully ambulatory and denies any significant dyspnea nor any chest discomfort whatsoever.  He notes a significant difference than what he was experiencing prior to his hospitalization.    REVIEW OF SYSTEMS:  General ROS: positive for fatigue???resolved  Psychological ROS: negative for - anxiety or depression  Ophthalmic ROS: negative for - decreased vision or visual distortion  ENT ROS: negative  Allergy and Immunology ROS: negative  Hematological and Lymphatic ROS: negative  Endocrine ROS: negative  Respiratory ROS: negative for - cough, hemoptysis, shortness of breath and wheezing  Cardiovascular ROS: positive for - chest pain ??? resolved , no claudication.   Gastrointestinal ROS: no abdominal pain, change in bowel habits, or black or bloody stools  Genito-Urinary ROS: no dysuria, trouble voiding, or hematuria  Musculoskeletal ROS: negative  Neurological ROS: no TIA or stroke symptoms otherwise no significant change in symptoms or problems since yesterday as documented in previous progress notes.    SCHEDULED MEDICATIONS:  ??? sodium chloride flush  10 mL Intravenous 2 times per day   ??? aspirin  81 mg Oral Daily   ??? ticagrelor  90 mg Oral BID   ??? atorvastatin  40 mg Oral Nightly   ??? pantoprazole  20 mg Oral QAM AC       VITAL SIGNS:                                                                                                                          Visit Vitals   ??? BP (!) 134/92   ??? Pulse 80   ??? Temp 97.8 ??F (36.6 ??C)   ??? Resp 16   ??? Ht 5\' 11"  (1.803 m)   ??? Wt 167 lb (75.8 kg)   ??? SpO2 97%   ??? BMI 23.29 kg/m2     Patient Vitals for the past 96 hrs (Last 3 readings):   Weight   06/09/15 1311 167 lb (75.8 kg)     OBJECTIVE:     HEENT: PERRL, EOM  Intact; sclera non-icteric, conjunctiva pink. Carotids are brisk in upstroke with normal contour. No carotid bruits. Normal jugular venous pulsation at 45??. No palpable cervical nor supraclavicular nodes.Thyroid not palpable. Trachea midline.  Chest: Even excursion  Lungs: CTA B, no expiratory wheezes or rhonchi, no decreased tactile fremitus without inspiratory rales.  Heart: Regular rate and rhythm; S1 > S2, no gallop or murmur. No clicks, rubs, thrills or heaves. PMI nondisplaced, 5th intercostal space MCL.   Abdomen: Soft, nontender, nondistended,  scaphoid, no masses or organomegaly.  Bowel sounds active.  Extremities: Without clubbing, cyanosis or edema. Pulses present 3+ upper  extermities bilaterally; present 2+ DP and present 2+ PT bilaterally.     Data:   Scheduled Meds: Reviewed  Continuous Infusions:      Intake/Output Summary (Last 24 hours) at 06/10/15 1422  Last data filed at 06/09/15 2255   Gross per 24 hour   Intake              480 ml   Output                0 ml   Net              480 ml     CBC:   Recent Labs      06/07/15   1945  06/09/15   0555   WBC  9.6  7.1   HGB  15.6  16.5   HCT  47.5  49.9   PLT  273  246     BMP:  Recent Labs      06/07/15   1945  06/09/15   0555  06/10/15   1018   NA  140   --   138   K  4.2  4.4  4.0   CL  102   --   99   CO2  25   --   25   BUN  29*   --   17   CREATININE  0.8   --   0.6*   LABGLOM  >60   --   >60     ABGs: No results found for: PH, PO2, PCO2  INR:   Recent Labs      06/07/15   1945  06/09/15   0555   INR  1.1  1.1     PRO-BNP:   Lab Results   Component Value Date    PROBNP 90 06/07/2015      TSH: No results found for: TSH   Cardiac Injury Profile:   Recent Labs      06/07/15   1945  06/08/15   0010  06/08/15   0545  06/08/15   1340   CKTOTAL  116   --    --    --    CKMB  7.2   --    --    --    TROPONINI  <0.01  0.28*  0.24*  0.17*      Lipid Profile:   Lab Results   Component Value Date    TRIG 73 06/08/2015    HDL 48 06/08/2015     LDLCALC 171 06/08/2015    CHOL 234 06/08/2015      Hemoglobin A1C: No components found for: HGBA1C     RAD:   Nm Myocardial Spect Rest Multiple    Result Date: 06/08/2015  Patient MRN:  [id number] DOB: 1945-07-03 Age: 65 years Gender: Male Order Date:  06/08/2015 11:35 AM EXAM: NM MYOCARDIAL SPECT REST MULTI NUMBER OF IMAGES:  2 views INDICATION:  CHEST PAIN, ACUTE CORONARY SYNDROME SUSPECT  COMPARISON: None TECHNIQUE:  Using Lexiscan stress, 27.8 mCi of Tc-81m MIBI was injected intravenously followed by SPECT myocardial perfusion imaging.  Previously a resting SPECT myocardial perfusion study had been acquired following the intravenous injection of 10.3 mCi of Tc-50m MIBI. FINDINGS:  There is a normal distribution of left ventricular myocardial activity on both the Lexiscan stress and rest SPECT myocardial perfusion images.  Gated study shows normal left ventricular wall motion and myocardial thickening during systole. Resting left ventricular ejection fraction  54%.     Normal examination.  In particular, there is no evidence of left ventricular myocardium at risk for stress-induced ischemia. Bethanne Ginger, MD F.A.C.C.     Xr Chest Portable    Result Date: 06/07/2015  Patient MRN: [id number] DOB: 1945-08-04 Age:  15 years Gender: Male Order Date: 06/07/2015 7:59 PM Exam: XR CHEST PORTABLE Number of Views: 1 Indication:    CHEST PAIN Comparison: None Findings: There is a normal cardiomediastinal silhouette with clear lungs. No pneumothorax, pleural effusion or focal areas of airspace consolidation.     No radiographic evidence of acute cardiopulmonary disease.       EKG: See Report  Echo: See Report      IMPRESSIONS:  Principal Problem:    NSTEMI (non-ST elevated myocardial infarction) ([facility])  Active Problems:    Mixed hyperlipidemia    CAD in native artery    Status post insertion of drug eluting coronary artery stent      RECOMMENDATIONS:  At this time I have reviewed discharge instructions in detail with both Mr. And  Mrs. Goetter and have addressed issues regarding dosage of aspirin and discharge follow-up plan.he is to see Dr. Dena Billet in one week and I'll see him again in 3 months or sooner as clinically warranted.I will encourage him to maintain his LDL cholesterol within updated 2016 AHA/ACC cholesterol guidelines.    I have spent more than 30 minutes face to face with Walter Hale and reviewing notes and laboratory data, with greater than 50% of this time instructing and counseling the patient and his wife face to face regarding my findings and recommendations and I have answered all questions as posed to me by Mr. Gunnell and his wife.    Janine Ores, DO FACP,FACC,FSCAI      NOTE:  This report was transcribed using voice recognition software.  Every effort was made to ensure accuracy; however, inadvertent computerized transcription errors may be present

## 2015-06-10 NOTE — Progress Notes (Signed)
Subjective:    The patient is awake and alert.  Feels well post procedure yesterday.  No chest pain.  No dyspnea.  Tolerating activity.  Wants to go home.    Objective:    Visit Vitals   ??? BP 126/75   ??? Pulse 91   ??? Temp 97.7 ??F (36.5 ??C) (Oral)   ??? Resp 16   ??? Ht 5\' 11"  (1.803 m)   ??? Wt 167 lb (75.8 kg)   ??? SpO2 96%   ??? BMI 23.29 kg/m2       Heart:  Regular rhythm and rate, no murmurs, gallops, or rubs.  Lungs:  CTA bilaterally, no wheeze, rales or rhonchi  Abd: bowel sounds present, nontender, nondistended, no masses  Extrem:  No clubbing, cyanosis, or edema    BMP pending this AM      Assessment:    Patient Active Problem List   Diagnosis   ??? Acute coronary syndrome-s/p stent to LAD   ??? Mixed hyperlipidemia       Plan:    1) discharge home on current heart medications  2) ?beta blocker on discharge-defer to cardiology  3) follow up with PCP next week      Obie Dredge  MD  10:02 AM  06/10/2015

## 2015-06-10 NOTE — Procedures (Signed)
[facility]                    [address] [place]  [address]                                CARDIAC CATHETERIZATION    PATIENT NAME:  Walter Hale, Walter A.                 DOB:      August 24, 1945  MED REC NO:    [id number]                         ROOM:        ACCOUNT NO:    [id number]                       ADMISSION DATE: 06/09/2015  PHYSICIAN:     Rayvon Char. Kiely Cousar, DO                DATE OF PROCEDURE:  06/09/2015    INTERVENTION:    SCAI INDICATIONS:  A (9), indication 11, score 9.    CLINICAL HISTORY:  This 70year-old gentleman presented to [facility] on  06/08/2015, [place], following a 24-hour history of retrosternal  discomfort on at least 2 occasions that was rather severe, radiating into his  neck.  It was associated with mild diaphoresis and a feeling of  breathlessness. As it did not go away, he took 4 aspirins and came to the  Emergency Room, and he was subsequently admitted to the hospital, where his  initial troponin was negative, a subsequent second troponin was minimally  elevated, and his EKG revealed nonspecific ST-T wave changes. For this reason,  a nuclear stress test was obtained, which demonstrated no evidence of  ischemia; however, the patient did have reproducible symptoms.  For this  reason, cardiac catheterization was recommended, if the patient had continued  symptoms, but he elected to have catheterization presently, as he was afraid  to leave the hospital, based upon his clinical presentation and EKG findings,  despite his nuclear stress interpreted as normal.      Cardiac catheterization performed immediately prior to this procedure  demonstrated a severe 85% eccentric stenosis in the proximal anterior  descending artery. For this reason, coronary artery intervention was  initiated.    PROCEDURE:   Following initial cardiac catheterization, a 6-French introducer  sheath was passed into the right common femoral artery,  followed by flushing  with heparin. Next, Bivalirudin was initiated and placed on drip.    Next, an FL4 guiding catheter was advanced to the aortic root, where injection  of the left coronary artery was made, with craniocaudal LAO projection  demonstrating the 85% eccentric stenosis.    Next, a PT2 angled exchange wire was passed to the distal anterior descending  artery, followed by a 2.5 x 12 mm Apex balloon, which was inflated on 2  occasions to 6 atmospheres, with repeat injections demonstrating persistent  narrowing but with some improvement. Next, a 3.0 x 16 mm Synergy drug-eluting  stent was deployed at 18 atmospheres, and repeat injections now revealed 0%  residual narrowing. As there were no complications, the guiding catheter was  removed, and adequate hemostasis was obtained with sheath removal and an  ExoSeal hemostatic device.  A 10-pound sandbag was then  applied.      TOTAL AMOUNT OF DYE UTILIZED:  91 mL.      ESTIMATED BLOOD LOSS: Less than 5 mL.      CONCLUSION:  Successful coronary artery intervention with drug-eluting stent  to proximal left anterior descending artery.        Rayvon Char Heber Carolina, DO      D: 06/10/2015 17:42:27  T: 06/10/2015 18:32:55  DAH/nts  Job#: [id number]  Doc#: [id number]

## 2015-06-10 NOTE — Procedures (Signed)
[facility]                    [address] [place]  [address]                                CARDIAC CATHETERIZATION    PATIENT NAME:  Walter Hale, Walter A.                 DOB:      1946/05/17  MED REC NO:    [id number]                         ROOM:        ACCOUNT NO:    [id number]                       ADMISSION DATE: 06/09/2015  PHYSICIAN:     Rayvon Char. Alfons Sulkowski, DO                DATE OF PROCEDURE:  06/09/2015    I.  JUDKINS LEFT HEART CATHETERIZATION     SCAI INDICATOR:  A(9), indication 3, score 9.      PRESSURES:  AO 129/64, mean 86, LV 105/1.    SUMMARY OF INJECTIONS:       II.  SELECTIVE CORONARY ARTERIOGRAM:    A.   Right coronary artery:  Preponderant. A large, dominant vessel with mild  luminal wall irregularities of less than 5% in the midsegment. The distal  vessel was large and widely patent.    B.  Left coronary artery:    1.  Left main trunk:  Short with early bifurcation.  2.  Left anterior descending:  immediately before the takeoff of the septal  perforators there is an 85% eccentric, elongated segmental stenosis in the  proximal anterior descending artery. The distal vessel had mild luminal wall  irregularities noted. The first diagonal branch in its inferior segment had  35% stenosis at the bifurcation.  The distal vessel was of small to moderate  luminal caliber.   3.  Circumflex:  Widely patent throughout its course.    III.  Left ventricular angiogram:  Left ventricular cavity size is normal with  normal overall contractility. Estimated left ventricular ejection fraction is  70%.  There is no mitral regurgitation seen.  The ascending aorta was  nondilated.    CONCLUSIONS:  1.  Severely stenotic native coronary atherosclerosis.  2.  Normal left ventricular systolic function.        Rayvon Char Heber Carolina, DO      D: 06/10/2015 17:42:27  T: 06/10/2015 18:23:28  DAH/nts  Job#: [id number]  Doc#: [id number]

## 2015-06-10 NOTE — Progress Notes (Signed)
Discharge instructions given with free one month supply of brilinta along with review of all new medications, care of femoral site and activity post procedure. Follow up care discussed when to call doctor. Cardiac rehab offered and encouraged. Cardiac diet explained.

## 2015-06-10 NOTE — Consults (Signed)
Counseled patient on attending outpatient cardiac rehab after discharge. Pt seems interested and willing to do program. Pt was told to call us after he is discharged to setup appointment. Thanks for the referral!

## 2015-09-12 ENCOUNTER — Telehealth: Payer: Self-pay | Admitting: Behavioral Health

## 2015-09-12 NOTE — Telephone Encounter (Signed)
Unable to reach patient at time of Pre-Visit Call.  Left message for patient to return call when available.    

## 2015-09-13 ENCOUNTER — Encounter: Payer: Self-pay | Admitting: Physician Assistant

## 2015-09-13 ENCOUNTER — Ambulatory Visit (INDEPENDENT_AMBULATORY_CARE_PROVIDER_SITE_OTHER): Payer: Medicare Other | Admitting: Physician Assistant

## 2015-09-13 VITALS — BP 160/60 | HR 98 | Temp 98.0°F | Ht 69.0 in | Wt 154.0 lb

## 2015-09-13 DIAGNOSIS — R109 Unspecified abdominal pain: Secondary | ICD-10-CM

## 2015-09-13 DIAGNOSIS — N400 Enlarged prostate without lower urinary tract symptoms: Secondary | ICD-10-CM

## 2015-09-13 LAB — POC URINALSYSI DIPSTICK (AUTOMATED)
Bilirubin, UA: NEGATIVE
Glucose, UA: NEGATIVE
KETONES UA: NEGATIVE
Leukocytes, UA: NEGATIVE
Nitrite, UA: NEGATIVE
PH UA: 5
Protein, UA: NEGATIVE
RBC UA: NEGATIVE
UROBILINOGEN UA: 0.2

## 2015-09-13 MED ORDER — MELOXICAM 15 MG PO TABS: 15.0000 mg | ORAL_TABLET | Freq: Every day | ORAL | Status: DC

## 2015-09-13 MED ORDER — TAMSULOSIN HCL 0.4 MG PO CAPS: 0.4000 mg | ORAL_CAPSULE | Freq: Every day | ORAL | Status: DC

## 2015-09-13 NOTE — Progress Notes (Signed)
Pre visit review using our clinic review tool, if applicable. No additional management support is needed unless otherwise documented below in the visit note. 

## 2015-09-13 NOTE — Patient Instructions (Signed)
Please go to the lab for blood work. I will call you with your results. We are starting Flomax for your urinary symptoms. If your PSA level is elevated we will be sending you to a Urologist for further assessment.  Please start the mobic once daily with food for muscular pain. Your exam was good and the pain is only present with lateral flexion of your torso. Your abdominal exam looks good.   Follow-up with me in 1-2 weeks. We can do a physical at that time.

## 2015-09-14 LAB — COMPREHENSIVE METABOLIC PANEL
ALK PHOS: 65 U/L (ref 39–117)
ALT: 22 U/L (ref 0–53)
AST: 32 U/L (ref 0–37)
Albumin: 4.2 g/dL (ref 3.5–5.2)
BUN: 16 mg/dL (ref 6–23)
CO2: 30 mEq/L (ref 19–32)
CREATININE: 0.83 mg/dL (ref 0.40–1.50)
Calcium: 9.6 mg/dL (ref 8.4–10.5)
Chloride: 102 mEq/L (ref 96–112)
GFR: 96.83 mL/min (ref >60.00)
Glucose, Bld: 99 mg/dL (ref 70–99)
Potassium: 4.8 mEq/L (ref 3.5–5.1)
Sodium: 139 mEq/L (ref 135–145)
TOTAL PROTEIN: 6.7 g/dL (ref 6.0–8.3)
Total Bilirubin: 0.6 mg/dL (ref 0.2–1.2)

## 2015-09-14 LAB — PSA, MEDICARE: PSA: 3.18 ng/ml (ref 0.10–4.00)

## 2015-09-15 ENCOUNTER — Telehealth: Payer: Self-pay | Admitting: Physician Assistant

## 2015-09-15 MED ORDER — OMEPRAZOLE MAGNESIUM 20 MG PO TBEC: 20.0000 mg | DELAYED_RELEASE_TABLET | Freq: Every day | ORAL | Status: DC

## 2015-09-15 MED ORDER — LORATADINE 10 MG PO TABS: 10.0000 mg | ORAL_TABLET | Freq: Every day | ORAL | Status: DC

## 2015-09-15 NOTE — Telephone Encounter (Signed)
Caller name: Self  Can be reached: [phone]  Pharmacy:  [id number] [place] - [address] [phone] (Phone) [phone] (Fax)        Reason for call: Patient states that he has been taking Prilosec or something less expensive for reflux

## 2015-09-15 NOTE — Telephone Encounter (Signed)
Called and spoke with the pt and informed him of the note below.  Pt verbalized understanding.  Pt stated that he had to cancel his follow-up appt because he has to move.  He stated that once he gets moved and looks at his schedule he will give us a call back to schedule the follow-up.  New prescriptions sent to the pharmacy by e-script.//AB/CMA

## 2015-09-15 NOTE — Telephone Encounter (Deleted)
Patient would like to know if he can have Rx for Omeprazole and Allergy medication. It would be less expensive for him.Does not want referral if he can take a medication that can bring PSA level down.

## 2015-09-15 NOTE — Telephone Encounter (Signed)
Notes Recorded by Waldon MerlWilliam C Martin, PA-C on 09/15/2015 at 1:25 PM Medication will not decrease the PSA. There are other medications that will decrease the PSA but that is no the point of referral. The point is to make sure there is no concern for prostate cancer. I would very much recommend it but we can discuss at his follow-up. Ok to send in Rx for the two scripts mentioned.

## 2015-09-15 NOTE — Telephone Encounter (Signed)
Pt called in to follow up on previous message.  Pt is requesting a call back directly.    CB: [phone]

## 2015-09-15 NOTE — Telephone Encounter (Signed)
Notes Recorded by Lurline HareKaren E Carter, LPN on 1/61/09604/21/2017 at 1:05 PM Patient would like to know if he can have Rx for Omeprazole and Allergy medication. It would be less expensive for him.Does not want referral if he can take a medication that can bring PSA level down.

## 2015-09-15 NOTE — Telephone Encounter (Signed)
-----   Message from Waldon MerlWilliam C Martin, PA-C sent at 09/15/2015  7:16 AM EDT ----- Labs good overall. PSA is normal but is at the upper limit. Giving symptoms I want him to be seen by Urology. Ok to place referral if patient is willing. See how he is doing on the Flomax. Again he is to FU with me in 1 month.

## 2015-09-15 NOTE — Telephone Encounter (Signed)
Notes Recorded by Lurline HareKaren E Carter, LPN on 4/09/81194/21/2017 at 1:06 PM Patient just started Flomax yesterday.

## 2015-09-20 ENCOUNTER — Encounter: Payer: Medicare Other | Admitting: Physician Assistant

## 2015-09-24 NOTE — Progress Notes (Signed)
Patient presents to clinic today to establish care.  Acute Concerns: Patient presents to clinic today c/o R lower back pain described as sharp, first noticed 3 weeks ago. Patient endorses pain only occurs for a couple of seconds each morning when rolling out of bed. Denies trauma or injury. Denies radiation of pain. Endorses pain leaves as quickly as it comes.  Patient also endorses history of enlarged prostate. Is not currently on medications. Endorses urinary frequency with nocturia x 3-4. Denies weight loss, unexplained fevers. Denies urinary hesitancy, dysuria or hematuria.     Past Medical History  Diagnosis Date  . Arthritis   . Depression   . GERD (gastroesophageal reflux disease)   . Allergy   . Hay fever   . Heart murmur   . Hyperlipidemia     Past Surgical History  Procedure Laterality Date  . Eye surgery    . Knee surgery    . Esophagogastroduodenoscopy N/A 09/01/2013    Procedure: ESOPHAGOGASTRODUODENOSCOPY (EGD);  Surgeon: Irene Shipper, MD;  Location: [facility];  Service: Endoscopy;  Laterality: N/A;    Current Outpatient Prescriptions on File Prior to Visit  Medication Sig Dispense Refill  . aspirin EC 81 MG tablet Take 81 mg by mouth daily.    Marland Kitchen ibuprofen (ADVIL,MOTRIN) 200 MG tablet Take 400 mg by mouth every 6 (six) hours as needed for moderate pain.    . Multiple Vitamins-Minerals (MULTIVITAMIN WITH MINERALS) tablet Take 1 tablet by mouth daily.     No current facility-administered medications on file prior to visit.    No Known Allergies  Family History  Problem Relation Age of Onset  . Arthritis Father   . Cancer Father     Colon  . Heart disease Father   . Arthritis Brother   . Emphysema Maternal Aunt     Social History   Social History  . Marital Status: Single    Spouse Name: N/A  . Number of Children: N/A  . Years of Education: N/A   Occupational History  . Not on file.   Social History Main Topics  . Smoking status: Current  Every Day Smoker  . Smokeless tobacco: Never Used  . Alcohol Use: 0.0 oz/week    0 Standard drinks or equivalent per week  . Drug Use: Not on file  . Sexual Activity: Not on file   Other Topics Concern  . Not on file   Social History Narrative    Review of Systems  Constitutional: Negative for fever and malaise/fatigue.  Cardiovascular: Negative for chest pain and palpitations.  Genitourinary: Positive for frequency. Negative for dysuria, urgency, hematuria and flank pain.       + Nocturia  Musculoskeletal: Positive for back pain. Negative for myalgias, joint pain, falls and neck pain.  Neurological: Negative for dizziness and loss of consciousness.  Psychiatric/Behavioral: Negative for depression. The patient is not nervous/anxious and does not have insomnia.     BP 160/60 mmHg  Pulse 98  Temp(Src) 98 F (36.7 C) (Oral)  Ht 5' 9"  (1.753 m)  Wt 154 lb (69.854 kg)  BMI 22.73 kg/m2  SpO2 99%  Physical Exam  Constitutional: He is oriented to person, place, and time and well-developed, well-nourished, and in no distress.  HENT:  Head: Normocephalic and atraumatic.  Eyes: Conjunctivae are normal.  Neck: Neck supple.  Cardiovascular: Normal rate, regular rhythm, normal heart sounds and intact distal pulses.   Pulmonary/Chest: Effort normal and breath sounds normal. No respiratory distress. He  has no wheezes. He has no rales. He exhibits no tenderness.  Genitourinary:  Pt declines DRE  Lymphadenopathy:    He has no cervical adenopathy.  Neurological: He is alert and oriented to person, place, and time.  Skin: Skin is warm and dry. No rash noted.  Psychiatric: Affect normal.  Vitals reviewed.   Recent Results (from the past 2160 hour(s))  POCT Urinalysis Dipstick (Automated)     Status: None   Collection Time: 09/13/15  3:16 PM  Result Value Ref Range   Color, UA yellow    Clarity, UA clear    Glucose, UA neg    Bilirubin, UA neg    Ketones, UA neg    Spec Grav, UA  >=1.030    Blood, UA neg    pH, UA 5.0    Protein, UA neg    Urobilinogen, UA 0.2    Nitrite, UA neg    Leukocytes, UA Negative Negative  Comp Met (CMET)     Status: None   Collection Time: 09/13/15  3:30 PM  Result Value Ref Range   Sodium 139 135 - 145 mEq/L   Potassium 4.8 3.5 - 5.1 mEq/L   Chloride 102 96 - 112 mEq/L   CO2 30 19 - 32 mEq/L   Glucose, Bld 99 70 - 99 mg/dL   BUN 16 6 - 23 mg/dL   Creatinine, Ser 0.83 0.40 - 1.50 mg/dL   Total Bilirubin 0.6 0.2 - 1.2 mg/dL   Alkaline Phosphatase 65 39 - 117 U/L   AST 32 0 - 37 U/L   ALT 22 0 - 53 U/L   Total Protein 6.7 6.0 - 8.3 g/dL   Albumin 4.2 3.5 - 5.2 g/dL   Calcium 9.6 8.4 - 10.5 mg/dL   GFR 96.83 >60.00 mL/min  PSA, Medicare     Status: None   Collection Time: 09/13/15  3:30 PM  Result Value Ref Range   PSA 3.18 0.10 - 4.00 ng/ml    Assessment/Plan: 1. Side pain Seems MSK in nature giving only occuring with certain movements when getting out of bed in the morning. Mild tenderness to internal rotation of R hip. Otherwise ROM within normal limits. Strength intact. Supportive measures and OTC medications reviewed.  - POCT Urinalysis Dipstick (Automated) - Comp Met (CMET)  2. BPH (benign prostatic hyperplasia) Defers prostate exam. Symptoms consistent with his history of BPH. Will check CMP and PSA. UA negative for infection. Will start Flomax 0.4 mg nightly. - Comp Met (CMET) - PSA, Medicare

## 2015-11-24 ENCOUNTER — Telehealth: Payer: Self-pay | Admitting: *Deleted

## 2015-11-24 MED ORDER — OMEPRAZOLE MAGNESIUM 20 MG PO TBEC: 20.0000 mg | DELAYED_RELEASE_TABLET | Freq: Every day | ORAL | Status: DC

## 2015-11-24 NOTE — Telephone Encounter (Signed)
Rx request to pharmacy/SLS Requested drug refills are authorized, however, the patient needs further evaluation and/or laboratory testing before further refills are given. Ask him to make an appointment for this.  Please call patient and schedule CPE with fasting labs [No Show 09/20/15] prior to future refill authorizations/SLS 06/30 Thanks.

## 2015-12-29 ENCOUNTER — Other Ambulatory Visit: Payer: Self-pay | Admitting: Physician Assistant

## 2016-01-01 ENCOUNTER — Telehealth: Payer: Self-pay | Admitting: Physician Assistant

## 2016-01-01 ENCOUNTER — Other Ambulatory Visit: Payer: Self-pay | Admitting: Physician Assistant

## 2016-01-01 MED ORDER — OMEPRAZOLE MAGNESIUM 20 MG PO TBEC: 20.0000 mg | DELAYED_RELEASE_TABLET | Freq: Every day | ORAL | 0 refills | Status: DC

## 2016-01-01 NOTE — Telephone Encounter (Signed)
A 30-day supply has been sent. No further refills until I see him. Follow-up is needed to discuss ongoing care. Omeprazole is not recommended long-term for elderly patients and he and I have never discussed acid reflux before.

## 2016-01-01 NOTE — Telephone Encounter (Signed)
Pt says that he need his medication immediately. Pt says that he has been taking his medication for years. Pt doesn't understand why he has to follow up for medication. Please call pt back to explain if possible. ALSO, scheduled pt for 8/15.    [facility] [place] [facility]

## 2016-01-03 NOTE — Telephone Encounter (Signed)
Called pt. LVM informing him that Rx is at pharmacy for pick up.

## 2016-01-09 ENCOUNTER — Ambulatory Visit: Payer: Medicare Other | Admitting: Physician Assistant

## 2016-03-26 ENCOUNTER — Other Ambulatory Visit: Payer: Self-pay | Admitting: Physician Assistant

## 2016-05-21 ENCOUNTER — Other Ambulatory Visit: Payer: Self-pay | Admitting: Physician Assistant

## 2016-05-29 ENCOUNTER — Telehealth: Payer: Self-pay | Admitting: Physician Assistant

## 2016-05-29 NOTE — Telephone Encounter (Signed)
No refill without appointment. He never followed up as directed.

## 2016-05-29 NOTE — Telephone Encounter (Signed)
Patient was last seen 09/13/15, last PSA 09/13/15 at 3.18. You wanted patient to follow up in 1 month. Flomax was filled 09/13/15 #30 3 rf. Please advise of refill.

## 2016-05-29 NOTE — Telephone Encounter (Signed)
Patient called to get a refill of flomax. He states that the pharmacy has been sending it but they have not heard back, I do not show any indication that we have received a request. He stresses that he is completely out and [place]. He is asking that we call it in today or tomorrow morning at the latest. He asks that we give him a call to advise. He is using the [facility] on file  Please follow up with patient.

## 2016-05-30 NOTE — Telephone Encounter (Signed)
Unfortunately he was due for follow-up in May. He has been non-compliant with follow-up and recommendations.  He needs to schedule a follow-up with me for reassessment before I will continue medications. He has only been seen once.

## 2016-05-30 NOTE — Telephone Encounter (Signed)
Patient called back and advised per PCP he would need an follow up appt for more refills. His PSA was upper limits of normal. Patient states he is going to [place] for 2 months, leaving tomorrow. He just ran out of medication and pharmacy gave 3 pills for emergency. He states he is not having any urinary issues since starting the medication, Does not see Urology. He states he is unable to afford the $50 copay for office visit. He wanted 30 day supply. I advised patient several times he would need a need an appointment for refills but still wanted me to check with PCP

## 2016-05-30 NOTE — Telephone Encounter (Signed)
LMOVM advising patient the Flomax would not be refilled since he did not follow up. Patient needs a follow up appointment.

## 2016-05-30 NOTE — Telephone Encounter (Signed)
Spoke with patient to let him know that we cannot fill this medication.  I let him know that he MUST schedule an appointment to get medication.   He stated that he was leaving town for at least two months and he would not be able to come in.  I explained that no medication refills would be given.  I explained that this is for his own safety and that he cannot simply go off "feeling good" he has to also be compliant with follow-up visits.  He said that he would see if anyone in [place] could give him anything while he is there and then asked if Selena BattenCody would refill it after that.   I explained again that he would have to schedule a follow-up visit.. Patient stated that he understood and said that he would call back to schedule his appointment.      At the end of the conversation he said his Pharmacy gave him 3 flomax tablets, and he asked if we could give him a little more just to get him through - I explained that we could not do that.     He said that he was going to call the pharmacy and see if they would give him anymore.  Again, I advised that he just make the appointment to see Selena BattenCody, and he declined and said that he would do that after his trip.

## 2016-07-30 ENCOUNTER — Other Ambulatory Visit: Payer: Self-pay | Admitting: Physician Assistant

## 2016-09-11 ENCOUNTER — Encounter: Payer: Self-pay | Admitting: Physician Assistant

## 2016-09-11 ENCOUNTER — Telehealth: Payer: Self-pay | Admitting: Physician Assistant

## 2016-09-11 NOTE — Telephone Encounter (Signed)
Ok to transfer. 

## 2016-09-11 NOTE — Telephone Encounter (Signed)
lvm advising patient of message below °

## 2016-09-11 NOTE — Telephone Encounter (Signed)
error:[id number] ° °

## 2016-09-11 NOTE — Telephone Encounter (Signed)
Caller name:Self call back number:[phone] Pharmacy: [facility] [address] [place], [place] [address] [phone]  Reason for call:   Patient requesting to transfer from [facility] to Dr. Carmelia Roller due to the [facility] location being to far, please advise.  Patient requesting a 30 day supply of omeprazole (PRILOSEC) 20 MG capsule to hold him over until new patient appointment, please advise

## 2016-09-11 NOTE — Telephone Encounter (Signed)
Ok to transfer. Can get omeprazole over the counter at same dose. Have not seen him in over a year so will not refill medication.

## 2016-09-26 ENCOUNTER — Ambulatory Visit: Payer: Medicare Other | Admitting: Family Medicine

## 2016-09-26 NOTE — Progress Notes (Signed)
Pre visit review using our clinic review tool, if applicable. No additional management support is needed unless otherwise documented below in the visit note. 

## 2016-09-26 NOTE — Progress Notes (Deleted)
Subjective:   Casey Ashley is a 71 y.o. male who presents for an Initial Medicare Annual Wellness Visit.  Review of Systems  No ROS.  Medicare Wellness Visit.    Sleep patterns: Home Safety/Smoke Alarms:   Living environment; residence and Firearm Safety:  Seat Belt Safety/Bike Helmet: Wears seat belt.   Counseling:   Eye Exam-  Dental-  Male:   CCS-     PSA-  Lab Results  Component Value Date   PSA 3.18 09/13/2015   PSA 2.75 09/03/2007      Objective:    There were no vitals filed for this visit. There is no height or weight on file to calculate BMI.  Current Medications (verified) Outpatient Encounter Prescriptions as of 09/30/2016  Medication Sig  . aspirin EC 81 MG tablet Take 81 mg by mouth daily.  Marland Kitchen ibuprofen (ADVIL,MOTRIN) 200 MG tablet Take 400 mg by mouth every 6 (six) hours as needed for moderate pain.  Marland Kitchen loratadine (CLARITIN) 10 MG tablet Take 1 tablet (10 mg total) by mouth daily.  . meloxicam (MOBIC) 15 MG tablet Take 1 tablet (15 mg total) by mouth daily.  . Multiple Vitamins-Minerals (MULTIVITAMIN WITH MINERALS) tablet Take 1 tablet by mouth daily.  Marland Kitchen omeprazole (PRILOSEC) 20 MG capsule TAKE 1 CAPSULE BY MOUTH ONCE DAILY  . tamsulosin (FLOMAX) 0.4 MG CAPS capsule Take 1 capsule (0.4 mg total) by mouth daily after supper.   No facility-administered encounter medications on file as of 09/30/2016.     Allergies (verified) Patient has no known allergies.   History: Past Medical History:  Diagnosis Date  . Allergy   . Arthritis   . Depression   . GERD (gastroesophageal reflux disease)   . Hay fever   . Heart murmur   . Hyperlipidemia    Past Surgical History:  Procedure Laterality Date  . ESOPHAGOGASTRODUODENOSCOPY N/A 09/01/2013   Procedure: ESOPHAGOGASTRODUODENOSCOPY (EGD);  Surgeon: Hilarie Fredrickson, MD;  Location: [facility];  Service: Endoscopy;  Laterality: N/A;  . EYE SURGERY    . KNEE SURGERY     Family History  Problem Relation Age  of Onset  . Arthritis Father   . Cancer Father     Colon  . Heart disease Father   . Arthritis Brother   . Emphysema Maternal Aunt    Social History   Occupational History  . Not on file.   Social History Main Topics  . Smoking status: Current Every Day Smoker  . Smokeless tobacco: Never Used  . Alcohol use 0.0 oz/week  . Drug use: Unknown  . Sexual activity: Not on file   Tobacco Counseling Ready to quit: Not Answered Counseling given: Not Answered   Activities of Daily Living No flowsheet data found.  Immunizations and Health Maintenance  There is no immunization history on file for this patient. Health Maintenance Due  Topic Date Due  . Hepatitis C Screening  [birth year]  . DTaP/Tdap/Td (1 - Tdap) 10/26/1962  . TETANUS/TDAP  10/26/1962  . COLONOSCOPY  10/25/1993  . PNA vac Low Risk Adult (1 of 2 - PCV13) 10/25/2008    Patient Care Team: Waldon Merl, PA-C as PCP - General (Family Medicine)  Indicate any recent Medical Services you may have received from other than [facility] providers in the past year (date may be approximate).    Assessment:   This is a routine wellness examination for Casey Ashley. ***  Hearing/Vision screen No exam data present  Dietary issues and exercise activities  discussed:    Goals    None     Depression Screen No flowsheet data found.  Fall Risk No flowsheet data found.  Cognitive Function:        Screening Tests Health Maintenance  Topic Date Due  . Hepatitis C Screening  08/27/1945  . DTaP/Tdap/Td (1 - Tdap) 10/26/1962  . TETANUS/TDAP  10/26/1962  . COLONOSCOPY  10/25/1993  . PNA vac Low Risk Adult (1 of 2 - PCV13) 10/25/2008  . INFLUENZA VACCINE  12/25/2016        Plan:   ***  I have personally reviewed and noted the following in the patient's chart:   . Medical and social history . Use of alcohol, tobacco or illicit drugs  . Current medications and supplements . Functional ability and  status . Nutritional status . Physical activity . Advanced directives . List of other physicians . Hospitalizations, surgeries, and ER visits in previous 12 months . Vitals . Screenings to include cognitive, depression, and falls . Referrals and appointments  In addition, I have reviewed and discussed with patient certain preventive protocols, quality metrics, and best practice recommendations. A written personalized care plan for preventive services as well as general preventive health recommendations were provided to patient.     Casey Ashley, Casey Ashley, [place]   09/26/2016

## 2016-09-27 ENCOUNTER — Encounter: Payer: Self-pay | Admitting: Behavioral Health

## 2016-09-27 ENCOUNTER — Telehealth: Payer: Self-pay | Admitting: Behavioral Health

## 2016-09-27 NOTE — Telephone Encounter (Signed)
Unable to reach patient at time of Pre-Visit Call.  Left message for patient to return call when available.    

## 2016-09-27 NOTE — Telephone Encounter (Signed)
Pre-Visit Call completed with patient and chart updated.   Pre-Visit Info documented in Specialty Comments under SnapShot.    

## 2016-09-27 NOTE — Addendum Note (Signed)
Addended by: Harold BarbanBYRD, RONECIA E on: 09/27/2016 02:06 PM   Modules accepted: Orders

## 2016-09-30 ENCOUNTER — Ambulatory Visit (INDEPENDENT_AMBULATORY_CARE_PROVIDER_SITE_OTHER): Payer: Medicare Other | Admitting: Family Medicine

## 2016-09-30 ENCOUNTER — Encounter: Payer: Self-pay | Admitting: Family Medicine

## 2016-09-30 VITALS — BP 140/60 | HR 103 | Temp 98.2°F | Ht 69.0 in | Wt 163.4 lb

## 2016-09-30 DIAGNOSIS — Z136 Encounter for screening for cardiovascular disorders: Secondary | ICD-10-CM | POA: Diagnosis not present

## 2016-09-30 DIAGNOSIS — R339 Retention of urine, unspecified: Secondary | ICD-10-CM

## 2016-09-30 DIAGNOSIS — J302 Other seasonal allergic rhinitis: Secondary | ICD-10-CM | POA: Diagnosis not present

## 2016-09-30 DIAGNOSIS — F17210 Nicotine dependence, cigarettes, uncomplicated: Secondary | ICD-10-CM | POA: Diagnosis not present

## 2016-09-30 MED ORDER — OMEPRAZOLE 20 MG PO CPDR: 20.0000 mg | DELAYED_RELEASE_CAPSULE | Freq: Every day | ORAL | 1 refills | Status: DC

## 2016-09-30 MED ORDER — TAMSULOSIN HCL 0.4 MG PO CAPS: 0.4000 mg | ORAL_CAPSULE | Freq: Every day | ORAL | 3 refills | Status: DC

## 2016-09-30 MED ORDER — LEVOCETIRIZINE DIHYDROCHLORIDE 5 MG PO TABS: 5.0000 mg | ORAL_TABLET | Freq: Every evening | ORAL | 1 refills | Status: DC

## 2016-09-30 NOTE — Progress Notes (Signed)
Pre visit review using our clinic review tool, if applicable. No additional management support is needed unless otherwise documented below in the visit note. 

## 2016-09-30 NOTE — Progress Notes (Signed)
Chief Complaint  Patient presents with  . Transitions Of Care    pt requesting referral to Urology and needing med refill    Subjective: Patient is a 71 y.o. male here for transition of care.  1 year incomplete emptying. He was supposed to be referred to urology in the past, but things fell through. It is not changing in severity. He denies pain, increased frequency, bleeding, discharge, or fevers. No famhx of issues. He recently had a CPE in [place] where he was visiting a friend. Labs done there reportedly showed a PSA of 3.2, but he does not remember the exact figure. He plans on signing a records release form.   Also brought up runny nose throughout the year. Currently takes Claritin daily. Has used INCS in past but did not like it. Is wondering if anything else is available.   ROS: GU: as noted in HPI  Family History  Problem Relation Age of Onset  . Arthritis Father   . Cancer Father     Colon  . Heart disease Father   . Arthritis Brother   . Emphysema Maternal Aunt    Past Medical History:  Diagnosis Date  . Allergy   . Arthritis   . Depression   . GERD (gastroesophageal reflux disease)   . Hay fever   . Heart murmur   . Hyperlipidemia    No Known Allergies  Current Outpatient Prescriptions:  .  aspirin EC 81 MG tablet, Take 81 mg by mouth daily., Disp: , Rfl:  .  Multiple Vitamins-Minerals (MULTIVITAMIN WITH MINERALS) tablet, Take 1 tablet by mouth daily., Disp: , Rfl:  .  omeprazole (PRILOSEC) 20 MG capsule, Take 1 capsule (20 mg total) by mouth daily., Disp: 90 capsule, Rfl: 1 .  tamsulosin (FLOMAX) 0.4 MG CAPS capsule, Take 1 capsule (0.4 mg total) by mouth daily after supper., Disp: 90 capsule, Rfl: 3 .  levocetirizine (XYZAL) 5 MG tablet, Take 1 tablet (5 mg total) by mouth every evening., Disp: 90 tablet, Rfl: 1  Objective: BP 140/60 (BP Location: Left Arm, Patient Position: Sitting, Cuff Size: Normal)   Pulse (!) 103   Temp 98.2 F (36.8 C) (Oral)   Ht 5\' 9"   (1.753 m)   Wt 163 lb 6.4 oz (74.1 kg)   SpO2 98%   BMI 24.13 kg/m  General: Awake, appears stated age HEENT: MMM, EOMi Heart: RRR, 3/6 SEM heard loudest at aortic listening post w radiation to b/l carotids, no LE edema Lungs: CTAB, no rales, wheezes or rhonchi. No accessory muscle use Abd: BS+, soft, NT, ND, no pulsatile masses or organomegaly Psych: Age appropriate judgment and insight, normal affect and mood  Assessment and Plan: Incomplete emptying of bladder - Plan: Ambulatory referral to Urology  Screening for AAA (abdominal aortic aneurysm) - Plan: US Aorta Initial Medicare Screen  Smokes with greater than 30 pack year history - Plan: CT CHEST LUNG CANCER SCREENING LOW DOSE WO CONTRAST  Seasonal allergic rhinitis, unspecified trigger  OK to refer to urology. Other others as above. Will change Claritin to Xyzal to see if it helps with his symptoms. OK with BP given age.  F/u in 6 mo or prn. He did bring up numbness in 2 of his toes, chronic issue, but I would like to see labs first. If still bothering him, schedule appt to discuss.  The patient voiced understanding and agreement to the plan.  Greater than 25 minutes were spent face to face with the patient with greater  than 50% of this time spent counseling on preventative health imaging, immunizations, urologic complaints, and follow up.   Jilda Roche [place], DO 09/30/16  2:45 PM

## 2016-09-30 NOTE — Patient Instructions (Signed)
If you do not hear anything about your referral in the next 1-2 weeks, call our office and ask for an update.  If you are having issues with your toes and your records are unremarkable, schedule an appointment sooner than 6 mo.

## 2016-10-04 ENCOUNTER — Telehealth: Payer: Self-pay | Admitting: Family Medicine

## 2016-10-04 NOTE — Telephone Encounter (Signed)
Pt would like to have a call when records are received.

## 2016-10-28 ENCOUNTER — Telehealth: Payer: Self-pay | Admitting: Family Medicine

## 2016-10-28 NOTE — Telephone Encounter (Signed)
Returning call.

## 2016-10-28 NOTE — Telephone Encounter (Signed)
I didn't do one because it wasn't going to change the plan of sending him to a urologist where he will likely get one. I do rectal exams to check the prostate in cases where it does affect management. Apologies for any confusion. TY.

## 2016-10-28 NOTE — Telephone Encounter (Signed)
°  Relation to ZO:XWRUpt:self Call back number:[phone]  Reason for call:  Patient would like to know why wasn't a rectal glove test or digital rectal exam conducted at he's last appointment and would like to know if PCP conducts this type of examination, please advise

## 2016-10-28 NOTE — Telephone Encounter (Signed)
[facility] @ 6:16pm @ [phone]) asking the pt to RTC regarding message below.//AB/CMA

## 2016-10-28 NOTE — Telephone Encounter (Signed)
Called and [facility] @ 2:56pm @ [phone]) asking the pt to RTC regarding message below.//AB/CMA

## 2016-10-29 ENCOUNTER — Telehealth: Payer: Self-pay | Admitting: Family Medicine

## 2016-10-29 NOTE — Telephone Encounter (Signed)
Spoke to the patient and he stated his insurance pays for a annual digital rectal exam. In the past he always had a glove test.  He was just disappointed this was not done. He did not understand why it was not done.  I did explain response by MD to him, but he was still confused regarding the entire procedure. He stated would MD not be able to treat an enlarged prostate?? And not have to see a specialist.

## 2016-10-29 NOTE — Telephone Encounter (Signed)
Patient returned call from [place]. Request call back.

## 2016-10-29 NOTE — Telephone Encounter (Signed)
Spoke to the patient but documented on note from yesterday 10/28/16

## 2016-10-29 NOTE — Telephone Encounter (Signed)
Called left message to call back. Refer to note in basket regarding this message

## 2016-10-29 NOTE — Telephone Encounter (Signed)
Wendling pt. Pt ph [phone]. Pt concerns about his prostate. Says angie called him yesterday pt would like return call today or tomorrow.

## 2016-10-29 NOTE — Telephone Encounter (Signed)
I was following through with plan from his previous PCP. I am happy to manage this, in which case I would perform a gloved digital rectal exam. As far as doing them yearly with a physical, that is no longer recommended for prostate cancer screening by any guideline. It seems there was a big misunderstanding on my part and if Mr Casey Ashley would like to come in to have this done and have this issue be managed by us, I'd be happy to do so. Again, apologies for the misunderstanding/miscommunication.

## 2016-10-30 NOTE — Telephone Encounter (Signed)
[facility] and [facility] @ 5:29pm @ [phone]) asking the pt to RTC regarding message below.//AB/CMA

## 2016-11-04 NOTE — Telephone Encounter (Signed)
[facility] and [facility] @ 9:11am @ [phone]) asking the pt to RTC regarding message below.//AB/CMA

## 2016-11-04 NOTE — Telephone Encounter (Signed)
Called and spoke with the pt and informed him of the message below.  Pt verbalized understanding.  Pt stated that his insurance does pay for digital rectal exam.  Informed the pt again that Dr. Carmelia RollerWendling will be happy to manage it.  Pt said he was been sent to Urology and if they check to see if the prostate is enlarged or what.  Informed him that yes the Urology can check the prostate as well.  Informed the pt that the referral to the Urology was for incomplete emptying of bladder.  He said that his prostate level was low one time.  Informed him that his last PSA was done (09/13/15 and it was normal.  Pt stated that he will go ahead and see Urology, and will keep us up to date on if he want to come in for a digital rectal exam.//AB/CMA

## 2016-11-07 ENCOUNTER — Telehealth: Payer: Self-pay | Admitting: *Deleted

## 2016-11-07 NOTE — Telephone Encounter (Signed)
[facility], forwarded to provider/SLS 06/14

## 2017-01-03 ENCOUNTER — Telehealth: Payer: Self-pay | Admitting: Family Medicine

## 2017-01-03 NOTE — Telephone Encounter (Signed)
Pt returned call.   CB: [phone]

## 2017-01-03 NOTE — Telephone Encounter (Signed)
Caller name: Casey Ashley  Relation to pt: self Call back number: [phone] Pharmacy:  Reason for call: Pt call wanting to know if we [place], it was  received since 06-19-2016 (records on file scanned in on 06-19-2016- [facility] that [facility] is on file) pt would like to know if he needs a referral a lung specialist or is it an ultrasound, pt was not sure what was the next procedure for him to do. Pt would like to speak with nurse concerning about making appt with provider or the mentioned above. Please advise ASAP. (Also pt stated that he was to be called ASA we received his medical records)

## 2017-01-03 NOTE — Telephone Encounter (Signed)
Called and spoke with the pt regarding recent office notes from past provider.  Pt stated that Dr. Carmelia RollerWendling wanted to do a scan here in the office.  I explained to the pt that we do not do scan here in our office.  Pt stated that he miss understood what Dr. Carmelia RollerWendling was saying.  Informed the pt that he will need to come in and go over the past records with Dr. Carmelia RollerWendling to see what he would like to do.  Pt verbalized understanding and agreed.  Pt was scheduled with Dr. Carmelia RollerWendling on (Wed-01/08/17).//AB/CMA

## 2017-01-03 NOTE — Telephone Encounter (Signed)
[facility] @ 2:12pm @ [phone]) asking the pt to RTC regarding the message below.//AB/CMA

## 2017-01-08 ENCOUNTER — Ambulatory Visit: Payer: Medicare Other | Admitting: Family Medicine

## 2017-01-29 ENCOUNTER — Ambulatory Visit: Payer: Medicare Other | Admitting: Family Medicine

## 2017-01-30 ENCOUNTER — Ambulatory Visit: Payer: Medicare Other | Admitting: Family Medicine

## 2017-02-12 ENCOUNTER — Ambulatory Visit: Payer: Medicare Other | Admitting: Family Medicine

## 2017-02-12 ENCOUNTER — Telehealth: Payer: Self-pay | Admitting: Family Medicine

## 2017-02-12 NOTE — Telephone Encounter (Signed)
Patient cancelled he's 10am follow up appointment stating he will have to call once he checks he's schedule, charge or no charge

## 2017-02-12 NOTE — Telephone Encounter (Signed)
If he has a good excuse, no charge. If not, charge.

## 2017-02-12 NOTE — Telephone Encounter (Signed)
Do to work schedule. No charge

## 2017-04-02 ENCOUNTER — Ambulatory Visit: Payer: Medicare Other | Admitting: Family Medicine

## 2017-04-05 ENCOUNTER — Emergency Department (HOSPITAL_COMMUNITY): Payer: Medicare Other

## 2017-04-05 ENCOUNTER — Encounter (HOSPITAL_COMMUNITY): Payer: Self-pay | Admitting: Emergency Medicine

## 2017-04-05 ENCOUNTER — Other Ambulatory Visit: Payer: Self-pay

## 2017-04-05 ENCOUNTER — Emergency Department (HOSPITAL_COMMUNITY)
Admission: EM | Admit: 2017-04-05 | Discharge: 2017-04-05 | Disposition: A | Discharge status: Home / Self Care | Payer: Medicare Other | Attending: Emergency Medicine | Admitting: Emergency Medicine

## 2017-04-05 DIAGNOSIS — Z79899 Other long term (current) drug therapy: Secondary | ICD-10-CM | POA: Insufficient documentation

## 2017-04-05 DIAGNOSIS — M25512 Pain in left shoulder: Secondary | ICD-10-CM

## 2017-04-05 DIAGNOSIS — M7582 Other shoulder lesions, left shoulder: Secondary | ICD-10-CM

## 2017-04-05 DIAGNOSIS — Z7982 Long term (current) use of aspirin: Secondary | ICD-10-CM | POA: Diagnosis not present

## 2017-04-05 DIAGNOSIS — F17210 Nicotine dependence, cigarettes, uncomplicated: Secondary | ICD-10-CM | POA: Insufficient documentation

## 2017-04-05 DIAGNOSIS — M7552 Bursitis of left shoulder: Secondary | ICD-10-CM | POA: Insufficient documentation

## 2017-04-05 DIAGNOSIS — M779 Enthesopathy, unspecified: Secondary | ICD-10-CM | POA: Diagnosis not present

## 2017-04-05 MED ORDER — KETOROLAC TROMETHAMINE 30 MG/ML IJ SOLN
15.0000 mg | Freq: Once | INTRAMUSCULAR | Status: AC
  Administered 2017-04-05: 15 mg via INTRAMUSCULAR
  Filled 2017-04-05: qty 1

## 2017-04-05 MED ORDER — MELOXICAM 15 MG PO TABS: 15.0000 mg | ORAL_TABLET | Freq: Every day | ORAL | 0 refills | Status: AC

## 2017-04-05 NOTE — ED Triage Notes (Signed)
Per EMS-states he was putting his back pack over shoulder and pulled left shoulder-states increased pain and unable to sleep

## 2017-04-05 NOTE — ED Provider Notes (Signed)
[facility] Provider Note   CSN: [id number] Arrival date & time: 04/05/17  1019     History   Chief Complaint Chief Complaint  Patient presents with  . Shoulder Pain    HPI Caesar BookmanRobert E Suniga Jr. is a 71 y.o. male with a PMHx of depression, HLD, GERD, and heart murmur, who presents to the ED with complaints of left shoulder pain x2 days.  He states that he is "temporarily homeless" and rides his bike with a heavy backpack on.  States that 2 days ago he was loading his backpack on his shoulders, had his right shoulder already in the strap when he slung the backpack onto his left shoulder, "plopping it down hard" on his left shoulder.  He states that since then he has had significant pain, although today it has eased up a bit and has been better than the last 2 days.  He describes the pain is currently 1/10 although at worst it has been 10/10, constant throbbing left shoulder pain that radiates down his arm, worse with movement or laying on it, and improved with resting it in a position of comfort. He denies bruising, swelling, fevers, chills, CP, SOB, abd pain, N/V/D/C, hematuria, dysuria, numbness, tingling, focal weakness, or any other complaints at this time. 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    The history is provided by the patient and medical records. No language interpreter was used.  Shoulder Pain   This is a new problem. The current episode started 2 days ago. The problem occurs constantly. The problem has been gradually improving. The pain is present in the left shoulder. Quality: throbbing. The pain is at a severity of 1/10 (1/10 at this time, 10/10 at worst). The pain is mild. Associated symptoms include limited range of motion. Pertinent negatives include no numbness and no tingling. The symptoms are aggravated by activity. He has  tried rest for the symptoms. The treatment provided mild relief.    Past Medical History:  Diagnosis Date  . Allergy   . Arthritis   . Depression   . GERD (gastroesophageal reflux disease)   . Hay fever   . Heart murmur   . Hyperlipidemia     Patient Active Problem List   Diagnosis Date Noted  . OTHER DYSPHAGIA 09/11/2009    Past Surgical History:  Procedure Laterality Date  . EYE SURGERY    . KNEE SURGERY         Home Medications    Prior to Admission medications   Medication Sig Start Date End Date Taking? Authorizing Provider  aspirin EC 81 MG tablet Take 81 mg by mouth daily.    [provider]  levocetirizine (XYZAL) 5 MG tablet Take 1 tablet (5 mg total) by mouth every evening. 09/30/16   Sharlene DoryWendling, Nicholas Paul, DO  Multiple Vitamins-Minerals (MULTIVITAMIN WITH MINERALS) tablet Take 1 tablet by mouth daily.    [provider]  omeprazole (PRILOSEC) 20 MG capsule Take 1 capsule (20 mg total) by mouth daily. 09/30/16   Sharlene DoryWendling, Nicholas Paul, DO  tamsulosin (FLOMAX) 0.4 MG CAPS capsule Take 1 capsule (0.4 mg total) by mouth daily after supper. 09/30/16   Sharlene DoryWendling, Nicholas Paul, DO    Family History Family History  Problem Relation Age of Onset  . Arthritis Father   . Cancer Father  Colon  . Heart disease Father   . Arthritis Brother   . Emphysema Maternal Aunt     Social History Social History   Tobacco Use  . Smoking status: Current Every Day Smoker    Packs/day: 1.25    Years: 30.00    Pack years: 37.50    Types: Cigarettes  . Smokeless tobacco: Never Used  Substance Use Topics  . Alcohol use: Yes    Alcohol/week: 0.0 oz  . Drug use: Not on file     Allergies   Patient has no known allergies.   Review of Systems Review of Systems  Constitutional: Negative for chills and fever.  Respiratory: Negative for shortness of breath.   Cardiovascular: Negative for chest pain.  Gastrointestinal: Negative for abdominal pain,  constipation, diarrhea, nausea and vomiting.  Genitourinary: Negative for dysuria and hematuria.  Musculoskeletal: Positive for arthralgias. Negative for joint swelling and myalgias.  Skin: Negative for color change.  Allergic/Immunologic: Negative for immunocompromised state.  Neurological: Negative for tingling, weakness and numbness.  Psychiatric/Behavioral: Negative for confusion.   All other systems reviewed and are negative for acute change except as noted in the HPI.    Physical Exam Updated Vital Signs BP 126/61 (BP Location: Right Arm)   Pulse 68   Temp 98.7 F (37.1 C) (Oral)   Resp 20   Ht 5\' 9"  (1.753 m)   Wt 68 kg (150 lb)   SpO2 98%   BMI 22.15 kg/m   Physical Exam  Constitutional: He is oriented to person, place, and time. Vital signs are normal. He appears well-developed and well-nourished.  Non-toxic appearance. No distress.  Afebrile, nontoxic, NAD  HENT:  Head: Normocephalic and atraumatic.  Mouth/Throat: Mucous membranes are normal.  Eyes: Conjunctivae and EOM are normal. Right eye exhibits no discharge. Left eye exhibits no discharge.  Neck: Normal range of motion. Neck supple.  Cardiovascular: Normal rate and intact distal pulses.  Pulmonary/Chest: Effort normal. No respiratory distress.  Abdominal: Normal appearance. He exhibits no distension.  Musculoskeletal:       Left shoulder: He exhibits decreased range of motion (due to pain) and tenderness. He exhibits no swelling, no effusion, no crepitus, no deformity and normal pulse.       Arms: L shoulder with limited ROM due to pain, with moderate TTP to the lateral joint line just below the acromion overlying the deltoid region, over the bursa sac; no swelling/effusion, no bruising or erythema, no warmth, no crepitus/deformity, unable to perform apley scratch test due to pain, +pain with resisted int rotation and slight pain with resisted ext rotation, unable to perform empty can test due to pain. Distal  strength and sensation grossly intact in all extremities, distal pulses intact.    Neurological: He is alert and oriented to person, place, and time. He has normal strength. No sensory deficit.  Skin: Skin is warm, dry and intact. No rash noted.  Psychiatric: He has a normal mood and affect.  Nursing note and vitals reviewed.    ED Treatments / Results  Labs (all labs ordered are listed, but only abnormal results are displayed) Labs Reviewed - No data to display  EKG  EKG Interpretation None       Radiology Dg Shoulder Left  Result Date: 04/05/2017 CLINICAL DATA:  Left shoulder pain. EXAM: LEFT SHOULDER - 2+ VIEW COMPARISON:  None. FINDINGS: There is no evidence of fracture or dislocation. Mild osteoarthritic changes at the glenohumeral joint. Rounded calcific densities are seen posterior to  the humeral head. These may represent tendinous calcifications such as seen in calcific tendinitis or loose bodies within the left shoulder joint. IMPRESSION: Mild osteoarthritic changes at the glenohumeral joint. Rounded calcific densities are seen posterior to the humeral head. These may represent tendinous calcifications such as seen in calcific tendinitis or loose bodies within the left shoulder joint. Electronically Signed   By: Ted Mcalpineobrinka  Dimitrova M.D.   On: 04/05/2017 12:20    Procedures Procedures (including critical care time)  Medications Ordered in ED Medications  ketorolac (TORADOL) 30 MG/ML injection 15 mg (15 mg Intramuscular Given 04/05/17 1229)     Initial Impression / Assessment and Plan / ED Course  I have reviewed the triage vital signs and the nursing notes.  Pertinent labs & imaging results that were available during my care of the patient were reviewed by me and considered in my medical decision making (see chart for details).     71 y.o. male here with L shoulder pain x2 days after placing large backpack on his shoulder. Pt is temporarily homeless (has a safe  place to go for the winter, though), and rides his bike with a heavy backpack around town, 2 days ago he had his right shoulder in the pack and swung the backpack onto his left shoulder, and since then has had pain; it's eased up slightly, but still limits his ROM due to pain. On exam, pt with pain doing AROM, unable to perform apley scratch or empty can test, with focal tenderness to the lateral joint line below the acromion, overlying the bursa sac; NVI with soft compartments, no bruising or swelling, +pain with resisted internal rotation and slight pain with resisted ext rotation. Will give toradol IM and get xray to ensure no small fx, although likely either rotator cuff tendinopathy vs bursitis; will reassess shortly.   12:26 PM Xray with mild OA at the glenohumeral joint and rounded calcific densities likely from calcific tendinitis. Likely bursitis vs rotator cuff tendinitis/tendinopathy. Will give sling for comfort for no more than 3 days, then discussed ROM exercises to perform. Advised ice/heat use, tylenol use, will rx mobic to use as well, and f/up with PCP in 1wk for recheck and ongoing management of his symptoms. I explained the diagnosis and have given explicit precautions to return to the ER including for any other new or worsening symptoms. The patient understands and accepts the medical plan as it's been dictated and I have answered their questions. Discharge instructions concerning home care and prescriptions have been given. The patient is STABLE and is discharged to home in good condition.   Final Clinical Impressions(s) / ED Diagnoses   Final diagnoses:  Acute pain of left shoulder  Bursitis of left shoulder  Tendinitis of left rotator cuff    ED Discharge Orders        Ordered    meloxicam (MOBIC) 15 MG tablet  Daily     04/05/17 [address]1225       Shaylah Mcghie, [place], [place]PA-C 04/05/17 1233    Arby BarrettePfeiffer, Marcy, MD 04/05/17 1529

## 2017-04-05 NOTE — ED Notes (Addendum)
Pt is alert and oriented x 4 and is verbally responsive. Pt reports that he has left shoulder pain x 2 days that radiates down left arm that worsens with activity. Pt denies falls or injuries but dies report that he often backpacks. No deformites or signs of trauma is noted.

## 2017-04-05 NOTE — Discharge Instructions (Signed)
Wear shoulder sling for no more than 3 days, then begin performing gentle range of motion exercises. Alternate between ice and heat to your shoulder throughout the day, using an ice/heat pack for no more than 20 minutes at a time every hour for each. Use mobic daily as directed, don't use additional NSAIDs like ibuprofen/aleve/etc while taking mobic. Use additional tylenol as needed for additional pain relief.  Follow up with your regular doctor in 1 week for recheck of symptoms and ongoing management of your shoulder pain. Return to the ER for changes or worsening symptoms.

## 2017-04-26 ENCOUNTER — Other Ambulatory Visit: Payer: Self-pay | Admitting: Family Medicine

## 2017-10-01 ENCOUNTER — Ambulatory Visit: Payer: Medicare Other | Admitting: *Deleted

## 2017-10-25 ENCOUNTER — Other Ambulatory Visit: Payer: Self-pay | Admitting: Family Medicine

## 2017-11-17 ENCOUNTER — Other Ambulatory Visit: Payer: Self-pay | Admitting: Family Medicine

## 2017-11-19 ENCOUNTER — Other Ambulatory Visit: Payer: Self-pay | Admitting: Family Medicine

## 2017-11-19 NOTE — Telephone Encounter (Signed)
Copied from CRM [id number]. Topic: Quick Communication - Rx Refill/Question >> Nov 19, 2017 10:17 AM Floria RavelingStovall, Shana A wrote: Medication:  tamsulosin (FLOMAX) 0.4 MG CAPS capsule [Pharmacy Med   Has the patient contacted their pharmacy?  No  (Agent: If no, request that the patient contact the pharmacy for the refill.) (Agent: If yes, when and what did the pharmacy advise?)  Preferred Pharmacy (with phone number or street name): [facility] at [address]  Pt does not want to make an appt every though it was refuse due to pt need appt,  upset that he has to make an appt to get this med   Agent: Please be advised that RX refills may take up to 3 business days. We ask that you follow-up with your pharmacy.

## 2017-11-20 NOTE — Telephone Encounter (Signed)
LOV  09/30/16 Dr. Carmelia RollerWendling Last refill 09/30/16  # 90 with 3 refills

## 2017-11-21 MED ORDER — TAMSULOSIN HCL 0.4 MG PO CAPS: 0.4000 mg | ORAL_CAPSULE | Freq: Every day | ORAL | 0 refills | Status: DC

## 2017-11-21 NOTE — Telephone Encounter (Signed)
Pt's last OV 09/30/16 and has no future appts scheduled. Please advise refill request and follow up?

## 2017-11-21 NOTE — Telephone Encounter (Signed)
Needs appt, OK to give 1 mo supply in meantime. Could be physical or med check, his choice. TY.

## 2017-11-21 NOTE — Telephone Encounter (Signed)
Called left detailed message of PCP instructions. Also sent pharmacy a message as well,,,, filled for only 30 days no refills.

## 2018-01-15 ENCOUNTER — Encounter: Payer: Medicare Other | Admitting: Family Medicine

## 2018-01-24 ENCOUNTER — Other Ambulatory Visit: Payer: Self-pay | Admitting: Family Medicine

## 2018-01-28 ENCOUNTER — Encounter: Payer: Medicare Other | Admitting: Family Medicine

## 2018-02-06 ENCOUNTER — Encounter: Payer: Medicare Other | Admitting: Medical

## 2018-05-07 LAB — FECAL DNA COLORECTAL CANCER SCREENING (COLOGUARD): FIT-DNA (Cologuard): NEGATIVE

## 2018-06-30 DIAGNOSIS — R972 Elevated prostate specific antigen [PSA]: Secondary | ICD-10-CM

## 2018-07-01 ENCOUNTER — Inpatient Hospital Stay: Payer: MEDICARE | Primary: Family Medicine

## 2018-07-01 LAB — PSA, DIAGNOSTIC: PSA: 7.8 ng/mL — ABNORMAL HIGH (ref 0.00–4.00)

## 2018-08-18 ENCOUNTER — Inpatient Hospital Stay: Payer: MEDICARE | Primary: Family Medicine

## 2018-08-18 DIAGNOSIS — C61 Malignant neoplasm of prostate: Secondary | ICD-10-CM

## 2018-08-31 ENCOUNTER — Telehealth: Payer: Self-pay

## 2018-08-31 NOTE — Telephone Encounter (Signed)
Chart review

## 2018-08-31 NOTE — Telephone Encounter (Signed)
Copied from CRM [id number]. Topic: Appointment Scheduling - Scheduling Inquiry for Clinic >> Aug 31, 2018  8:07 AM Crist Infante wrote:  Reason for CRM:  pt states he was told on 4/1 by the scheduler this would be an office visit.  Advised pt I was not certain about that. Pt states he cannot do a web visit.  Pt states he needs office visit. Pt states he is staying in hotel, and checking out in the morning.  Request call back today. His cell phone not working.  Please call hotel  [phone]  room [address] Please call pt asap and advise him of appt.  Thank you.

## 2018-09-01 ENCOUNTER — Telehealth: Payer: Self-pay | Admitting: Family Medicine

## 2018-09-01 MED ORDER — LEVOCETIRIZINE DIHYDROCHLORIDE 5 MG PO TABS: 5.0000 mg | ORAL_TABLET | Freq: Every evening | ORAL | 1 refills | Status: DC

## 2018-09-01 MED ORDER — OMEPRAZOLE 20 MG PO CPDR: DELAYED_RELEASE_CAPSULE | ORAL | 0 refills | Status: DC

## 2018-09-01 MED ORDER — TAMSULOSIN HCL 0.4 MG PO CAPS: ORAL_CAPSULE | ORAL | 0 refills | Status: DC

## 2018-09-01 NOTE — Telephone Encounter (Signed)
Refilled medications///called left message refills done

## 2018-09-01 NOTE — Telephone Encounter (Signed)
Copied from CRM [id number]. Topic: Quick Communication - Rx Refill/Question >> Sep 01, 2018 11:59 AM Floria Raveling A wrote: Medication: omeprazole (PRILOSEC) 20 MG capsule [[id number]] - pt called in and stated he is completley out and he has an appt on 4/20 he is concerned about having the web visit  tamsulosin (FLOMAX) 0.4 MG CAPS capsule [[id number]] levocetirizine (XYZAL) 5 MG tablet [[id number]]   Has the patient contacted their pharmacy? No. (Agent: If no, request that the patient contact the pharmacy for the refill.) (Agent: If yes, when and what did the pharmacy advise?)  Preferred Pharmacy (with phone number or street name): [phone] - [place],  - [phone] [phone] (Phone)   Agent: Please be advised that RX refills may take up to 3 business days. We ask that you follow-up with your pharmacy.

## 2018-09-07 NOTE — Telephone Encounter (Signed)
Chart review

## 2018-09-14 ENCOUNTER — Encounter: Payer: Medicare Other | Admitting: Family Medicine

## 2018-09-18 ENCOUNTER — Telehealth: Payer: Self-pay | Admitting: Family Medicine

## 2018-09-18 NOTE — Telephone Encounter (Signed)
Last appt with PCP on 09/30/2016 Scheduled appt on 09/14/2018---but patient canceled .  Patient had been given refills previously based on upcoming appt. Called the patient left message that he needs to reschedule appt he canceled in April 2020 and currently we are doing virtual visits.

## 2018-10-13 NOTE — Telephone Encounter (Signed)
Chart review

## 2018-11-12 NOTE — Telephone Encounter (Signed)
Met with patient after his daily radiation treatment. I explained my role as Art therapist and provided my contact information.     Patient is tolerating radiation fine thus far. He is applying lotion to the radiation site every night before bed. Patient is scheduled for his second 3 month Lupron injection on 12/09/2018 at 0930 at [facility] in [place]. Patient states the only side effect of Lupron that he previously experienced was fatigue. Jonne Ply RTT was updated and patient's radiation schedule was adjusted accordingly.    Patient denies any transportation issues and confirms insurance and prescription drug coverage.     I provided patient literature consisting of Cancer Guide (Patient Resource), our listing of Local Resources for the Cancer Survivor, and the 2020 [place] prostate support group calendar. I encouraged him to stop by my office or contact me with any questions and/or concerns. Understanding was verbalized of all.

## 2018-12-07 ENCOUNTER — Other Ambulatory Visit: Payer: Self-pay | Admitting: Family Medicine

## 2018-12-09 ENCOUNTER — Inpatient Hospital Stay: Payer: MEDICARE | Primary: Family Medicine

## 2018-12-09 DIAGNOSIS — C61 Malignant neoplasm of prostate: Secondary | ICD-10-CM

## 2018-12-09 LAB — PSA, DIAGNOSTIC: PSA: 1.31 ng/mL (ref 0.00–4.00)

## 2019-01-14 NOTE — Telephone Encounter (Signed)
Chart review

## 2019-03-01 ENCOUNTER — Encounter: Payer: Medicare Other | Admitting: Family Medicine

## 2019-03-01 ENCOUNTER — Telehealth: Payer: Self-pay | Admitting: *Deleted

## 2019-03-01 NOTE — Telephone Encounter (Signed)
Left message machine for him to call to reschedule appointment if he still needs to.

## 2019-03-01 NOTE — Telephone Encounter (Signed)
Call Type Message Only Information Provided Reason for Call Request to Reschedule Office Appointment Initial Comment Caller states that he has an appointment Monday 10:30 and is needing to reschedule the appointment. Additional Comment Provided office hours. Declined triage. Call Closed By: Bryson Ha Transaction Date/Time: 02/27/2019 9:14:48 AM (ET)

## 2019-03-11 ENCOUNTER — Telehealth: Payer: Self-pay | Admitting: Family Medicine

## 2019-03-11 NOTE — Telephone Encounter (Signed)
Pt is due for follow up

## 2019-03-15 NOTE — Telephone Encounter (Signed)
Pt last OV 09/2016 - LM for pt to call and schedule OV

## 2019-03-16 MED ORDER — LEVOCETIRIZINE DIHYDROCHLORIDE 5 MG PO TABS: 5.0000 mg | ORAL_TABLET | Freq: Every evening | ORAL | 0 refills | Status: AC

## 2019-03-16 MED ORDER — TAMSULOSIN HCL 0.4 MG PO CAPS: ORAL_CAPSULE | ORAL | 0 refills | Status: DC

## 2019-03-16 MED ORDER — OMEPRAZOLE 20 MG PO CPDR: DELAYED_RELEASE_CAPSULE | ORAL | 0 refills | Status: DC

## 2019-03-16 NOTE — Telephone Encounter (Signed)
Patient is requesting enough medication until his cpe on 11/4 Call back [phone]

## 2019-03-16 NOTE — Addendum Note (Signed)
Addended by: Sharon Seller B on: 03/16/2019 02:21 PM   Modules accepted: Orders

## 2019-03-16 NOTE — Telephone Encounter (Signed)
appt on 03/31/2019

## 2019-03-16 NOTE — Telephone Encounter (Signed)
Refills done///patient has appt with PCP

## 2019-03-31 ENCOUNTER — Encounter: Payer: Medicare Other | Admitting: Family Medicine

## 2019-05-04 ENCOUNTER — Encounter: Payer: Medicare Other | Admitting: Family Medicine

## 2019-05-06 NOTE — Telephone Encounter (Signed)
Prepared patient's Cancer Treatment Summary / Survivorship Care Plan. Routed a copy to Dr. Dena Billet (PCP).

## 2019-07-07 ENCOUNTER — Telehealth: Payer: Self-pay | Admitting: Family Medicine

## 2019-07-07 MED ORDER — OMEPRAZOLE 20 MG PO CPDR: DELAYED_RELEASE_CAPSULE | ORAL | 0 refills | Status: AC

## 2019-07-07 MED ORDER — TAMSULOSIN HCL 0.4 MG PO CAPS: ORAL_CAPSULE | ORAL | 0 refills | Status: AC

## 2019-07-07 NOTE — Telephone Encounter (Signed)
Medication: tamsulosin (FLOMAX) 0.4 MG CAPS capsule    omeprazole (PRILOSEC) 20 MG capsule [[id number]   Has the patient contacted their pharmacy? No.   Preferred Pharmacy (with phone number or street name): [facility]   [address] [place] [address] [phone]   Agent: Please be advised that RX refills may take up to 3 business days. We ask that you follow-up with your pharmacy.

## 2019-07-07 NOTE — Telephone Encounter (Signed)
Refill done/has upcoming appt scheduled for march

## 2019-08-03 ENCOUNTER — Encounter: Payer: Medicare Other | Admitting: Family Medicine

## 2019-08-05 ENCOUNTER — Encounter

## 2019-08-05 ENCOUNTER — Inpatient Hospital Stay: Admit: 2019-08-05 | Payer: MEDICARE | Primary: Family Medicine

## 2019-08-05 ENCOUNTER — Inpatient Hospital Stay: Payer: MEDICARE | Primary: Family Medicine

## 2019-08-05 DIAGNOSIS — M170 Bilateral primary osteoarthritis of knee: Secondary | ICD-10-CM

## 2019-08-30 ENCOUNTER — Other Ambulatory Visit: Payer: Self-pay

## 2019-08-31 ENCOUNTER — Encounter: Payer: Medicare Other | Admitting: Family Medicine

## 2019-09-29 ENCOUNTER — Encounter: Payer: Medicare Other | Admitting: Family Medicine

## 2019-10-19 ENCOUNTER — Telehealth: Payer: Self-pay | Admitting: Family Medicine

## 2019-10-19 NOTE — Telephone Encounter (Signed)
Agree. Ty

## 2019-10-19 NOTE — Telephone Encounter (Signed)
We have some openings on June 1 that can be offered to him.

## 2019-10-19 NOTE — Telephone Encounter (Signed)
Caller: Molly Maduro Call back phone number:[phone]  Patient would like to know if you are going to fill his medication. Patient states at least give me enough till physical appointment.  Please advise.

## 2019-10-19 NOTE — Telephone Encounter (Signed)
I have refused refills for this patient this morning based on needing to keep upcoming appt in June for RF's.. Last OV was on 09/30/2016--Upcoming scheduled appt is on 11/10/2019 The patient has had numerous cancellations Please advise on refilling this medication.

## 2019-10-19 NOTE — Telephone Encounter (Signed)
Caller: Molly Maduro Call back phone number: [phone] .rxref Medication: tamsulosin (FLOMAX) 0.4 MG CAPS capsule  omeprazole (PRILOSEC) 20 MG capsule levocetirizine (XYZAL) 5 MG tablet     Has the patient contacted their pharmacy? Yes (If no, request that the patient contact the pharmacy for the refill.) (If yes, when and what did the pharmacy advise?) deny     Preferred Pharmacy (with phone number or street name):  [id number] [place], [place] - [phone] AT [facility] [address]  [address] [place] [address]  Phone:  [phone] Fax:  [phone]

## 2019-10-20 NOTE — Telephone Encounter (Signed)
Left message for the patient to call back to schedule appointment. 

## 2019-11-03 ENCOUNTER — Encounter: Payer: Medicaid Other | Admitting: Family Medicine

## 2019-11-10 ENCOUNTER — Encounter: Payer: Medicaid Other | Admitting: Family Medicine

## 2019-11-30 ENCOUNTER — Encounter: Payer: Medicaid Other | Admitting: Family Medicine

## 2019-12-23 LAB — PSA, DIAGNOSTIC: PSA: <0.03 ng/mL (ref 0.00–4.00)

## 2020-01-04 ENCOUNTER — Telehealth: Payer: Self-pay | Admitting: Family Medicine

## 2020-01-04 NOTE — Telephone Encounter (Signed)
I'm not taking new patients. Ty.

## 2020-01-04 NOTE — Telephone Encounter (Signed)
Caller Rye Decoste  Call Back # [phone]  Patient states that he would like to re-establish care with you. Patient last seen 09/2016  Please Advise

## 2020-02-02 ENCOUNTER — Ambulatory Visit: Payer: Medicaid Other | Admitting: Family Medicine

## 2020-02-07 ENCOUNTER — Ambulatory Visit: Payer: Medicaid Other | Admitting: Family Medicine

## 2020-02-29 ENCOUNTER — Ambulatory Visit: Payer: Medicaid Other | Admitting: Family Medicine

## 2020-03-07 LAB — PSA, DIAGNOSTIC: PSA: <0.03 ng/mL (ref 0.00–4.00)

## 2020-03-09 LAB — TESTOSTERONE: Testosterone: 15 ng/dL

## 2020-04-03 ENCOUNTER — Ambulatory Visit: Payer: Medicaid Other | Admitting: Nurse Practitioner

## 2020-05-13 ENCOUNTER — Encounter (HOSPITAL_COMMUNITY): Payer: Self-pay | Admitting: Emergency Medicine

## 2020-05-13 ENCOUNTER — Other Ambulatory Visit: Payer: Self-pay

## 2020-05-13 DIAGNOSIS — F17210 Nicotine dependence, cigarettes, uncomplicated: Secondary | ICD-10-CM | POA: Diagnosis not present

## 2020-05-13 DIAGNOSIS — Z79899 Other long term (current) drug therapy: Secondary | ICD-10-CM | POA: Diagnosis not present

## 2020-05-13 DIAGNOSIS — R339 Retention of urine, unspecified: Secondary | ICD-10-CM | POA: Diagnosis not present

## 2020-05-13 DIAGNOSIS — N39490 Overflow incontinence: Secondary | ICD-10-CM | POA: Diagnosis not present

## 2020-05-13 DIAGNOSIS — K219 Gastro-esophageal reflux disease without esophagitis: Secondary | ICD-10-CM | POA: Insufficient documentation

## 2020-05-13 DIAGNOSIS — Z7982 Long term (current) use of aspirin: Secondary | ICD-10-CM | POA: Insufficient documentation

## 2020-05-13 DIAGNOSIS — R1031 Right lower quadrant pain: Secondary | ICD-10-CM | POA: Diagnosis present

## 2020-05-13 NOTE — ED Triage Notes (Signed)
Patient states he has been having urinary incontinence for about 2 weeks, no dysuria. Patient states he had been taking flomax for 10 years, but that was stopped in august. Patient is having RLQ pain, pain is intermittent.

## 2020-05-14 ENCOUNTER — Emergency Department (HOSPITAL_COMMUNITY): Payer: Medicare Other

## 2020-05-14 ENCOUNTER — Emergency Department (HOSPITAL_COMMUNITY)
Admission: EM | Admit: 2020-05-14 | Discharge: 2020-05-14 | Disposition: A | Discharge status: Home / Self Care | Payer: Medicare Other | Attending: Emergency Medicine | Admitting: Emergency Medicine

## 2020-05-14 DIAGNOSIS — R339 Retention of urine, unspecified: Secondary | ICD-10-CM | POA: Diagnosis not present

## 2020-05-14 DIAGNOSIS — N39490 Overflow incontinence: Secondary | ICD-10-CM

## 2020-05-14 LAB — BASIC METABOLIC PANEL
Anion gap: 11 (ref 5–15)
BUN: 16 mg/dL (ref 8–23)
CO2: 28 mmol/L (ref 22–32)
Calcium: 8.2 mg/dL — ABNORMAL LOW (ref 8.9–10.3)
Chloride: 100 mmol/L (ref 98–111)
Creatinine, Ser: 1.11 mg/dL (ref 0.61–1.24)
GFR, Estimated: >60 mL/min (ref >60)
Glucose, Bld: 103 mg/dL — ABNORMAL HIGH (ref 70–99)
Potassium: 3.3 mmol/L — ABNORMAL LOW (ref 3.5–5.1)
Sodium: 139 mmol/L (ref 135–145)

## 2020-05-14 LAB — URINALYSIS, ROUTINE W REFLEX MICROSCOPIC
Bilirubin Urine: NEGATIVE
Glucose, UA: NEGATIVE mg/dL
Hgb urine dipstick: NEGATIVE
Ketones, ur: NEGATIVE mg/dL
Leukocytes,Ua: NEGATIVE
Nitrite: NEGATIVE
Protein, ur: NEGATIVE mg/dL
Specific Gravity, Urine: 1.006 (ref 1.005–1.030)
pH: 6 (ref 5.0–8.0)

## 2020-05-14 LAB — CBC WITH DIFFERENTIAL/PLATELET
Abs Immature Granulocytes: 0.04 10*3/uL (ref 0.00–0.07)
Basophils Absolute: 0 10*3/uL (ref 0.0–0.1)
Basophils Relative: 0 %
Eosinophils Absolute: 0 10*3/uL (ref 0.0–0.5)
Eosinophils Relative: 0 %
HCT: 33.7 % — ABNORMAL LOW (ref 39.0–52.0)
Hemoglobin: 11 g/dL — ABNORMAL LOW (ref 13.0–17.0)
Immature Granulocytes: 0 %
Lymphocytes Relative: 11 %
Lymphs Abs: 1.2 10*3/uL (ref 0.7–4.0)
MCH: 32.7 pg (ref 26.0–34.0)
MCHC: 32.6 g/dL (ref 30.0–36.0)
MCV: 100.3 fL — ABNORMAL HIGH (ref 80.0–100.0)
Monocytes Absolute: 1.2 10*3/uL — ABNORMAL HIGH (ref 0.1–1.0)
Monocytes Relative: 11 %
Neutro Abs: 8 10*3/uL — ABNORMAL HIGH (ref 1.7–7.7)
Neutrophils Relative %: 78 %
Platelets: 282 10*3/uL (ref 150–400)
RBC: 3.36 MIL/uL — ABNORMAL LOW (ref 4.22–5.81)
RDW: 12.4 % (ref 11.5–15.5)
WBC: 10.4 10*3/uL (ref 4.0–10.5)
nRBC: 0 % (ref 0.0–0.2)

## 2020-05-14 MED ORDER — TAMSULOSIN HCL 0.4 MG PO CAPS: 0.4000 mg | ORAL_CAPSULE | Freq: Every day | ORAL | 0 refills | Status: AC

## 2020-05-14 NOTE — ED Notes (Signed)
Bladder scanned pt post-void; scan showed >936ml x2 scans.

## 2020-05-14 NOTE — Discharge Instructions (Addendum)
You were seen today for urinary incontinence.  This is likely because your bladder became overfilled.  You have a Foley catheter.  Keep this in place until you follow-up with urology.  Call their office for follow-up appointment for catheter removal and urodynamic testing.  Take Flomax as directed.  If you develop fevers or any new or worsening symptoms you need to be reevaluated.

## 2020-05-14 NOTE — ED Provider Notes (Signed)
[facility]-EMERGENCY DEPT Provider Note   CSN: [id number] Arrival date & time: 05/13/20  2319     History Chief Complaint  Patient presents with  . Urinary Incontinence  . Abdominal Pain    RLQ    Casey Ashley. is a 74 y.o. male.  HPI      This is a 74 year old male with a history of hyperlipidemia and reflux who presents with urinary incontinence. Patient reports several week history of urinary incontinence. He describes peeing on himself "without any warning." He denies any urge. This is happening both during the day and at night. He denies any burning with urination. He has had some intermittent right lower quadrant pain. Currently he is pain-free. He denies any recent injuries, back pain, saddle anesthesia, lower extremity weakness. He states that he voids but does not feel like he fully empties his bladder.   Past Medical History:  Diagnosis Date  . Allergy   . Arthritis   . Depression   . GERD (gastroesophageal reflux disease)   . Hay fever   . Heart murmur   . Hyperlipidemia     Patient Active Problem List   Diagnosis Date Noted  . OTHER DYSPHAGIA 09/11/2009    Past Surgical History:  Procedure Laterality Date  . ESOPHAGOGASTRODUODENOSCOPY N/A 09/01/2013   Procedure: ESOPHAGOGASTRODUODENOSCOPY (EGD);  Surgeon: Hilarie Fredrickson, MD;  Location: [facility];  Service: Endoscopy;  Laterality: N/A;  . EYE SURGERY    . KNEE SURGERY         Family History  Problem Relation Age of Onset  . Arthritis Father   . Cancer Father        Colon  . Heart disease Father   . Arthritis Brother   . Emphysema Maternal Aunt     Social History   Tobacco Use  . Smoking status: Current Every Day Smoker    Packs/day: 1.25    Years: 30.00    Pack years: 37.50    Types: Cigarettes  . Smokeless tobacco: Never Used  Substance Use Topics  . Alcohol use: Yes    Alcohol/week: 0.0 standard drinks    Home Medications Prior to Admission medications    Medication Sig Start Date End Date Taking? Authorizing Provider  aspirin EC 81 MG tablet Take 81 mg by mouth daily.    [provider]  levocetirizine (XYZAL) 5 MG tablet Take 1 tablet (5 mg total) by mouth every evening. 03/16/19   Sharlene Dory, DO  meloxicam (MOBIC) 15 MG tablet Take 1 tablet (15 mg total) daily by mouth. TAKE WITH MEALS 04/05/17   Street, [place], PA-C  Multiple Vitamins-Minerals (MULTIVITAMIN WITH MINERALS) tablet Take 1 tablet by mouth daily.    [provider]  omeprazole (PRILOSEC) 20 MG capsule TAKE 1 CAPSULE BY MOUTH EVERY DAY 07/07/19   Sharlene Dory, DO  tamsulosin (FLOMAX) 0.4 MG CAPS capsule TAKE 1 CAPSULE BY MOUTH EVERY DAY AFTER SUPPER 07/07/19   Wendling, Jilda Roche, DO  tamsulosin (FLOMAX) 0.4 MG CAPS capsule Take 1 capsule (0.4 mg total) by mouth daily. 05/14/20   Dawnielle Christiana, Mayer Masker, MD    Allergies    Patient has no known allergies.  Review of Systems   Review of Systems  Constitutional: Negative for fever.  Respiratory: Negative for shortness of breath.   Cardiovascular: Negative for chest pain.  Gastrointestinal: Positive for abdominal pain. Negative for nausea and vomiting.  Genitourinary: Negative for frequency and urgency.  Incontinence  All other systems reviewed and are negative.   Physical Exam Updated Vital Signs BP (!) 142/80   Pulse 89   Temp 98.1 F (36.7 C) (Oral)   Resp 18   Ht 1.753 m (5\' 9" )   Wt 68 kg   SpO2 96%   BMI 22.15 kg/m   Physical Exam Vitals and nursing note reviewed.  Constitutional:      Appearance: He is well-developed and well-nourished. He is not ill-appearing.  HENT:     Head: Normocephalic and atraumatic.     Mouth/Throat:     Mouth: Mucous membranes are moist.  Eyes:     Pupils: Pupils are equal, round, and reactive to light.  Cardiovascular:     Rate and Rhythm: Normal rate and regular rhythm.     Heart sounds: Normal heart sounds. No murmur  heard.   Pulmonary:     Effort: Pulmonary effort is normal. No respiratory distress.     Breath sounds: Normal breath sounds. No wheezing.  Abdominal:     General: Bowel sounds are normal.     Palpations: Abdomen is soft.     Tenderness: There is no abdominal tenderness. There is no guarding or rebound.  Genitourinary:    Penis: Normal.   Musculoskeletal:        General: No edema.     Cervical back: Neck supple.  Lymphadenopathy:     Cervical: No cervical adenopathy.  Skin:    General: Skin is warm and dry.  Neurological:     Mental Status: He is alert and oriented to person, place, and time.  Psychiatric:        Mood and Affect: Mood and affect and mood normal.     ED Results / Procedures / Treatments   Labs (all labs ordered are listed, but only abnormal results are displayed) Labs Reviewed  CBC WITH DIFFERENTIAL/PLATELET - Abnormal; Notable for the following components:      Result Value   RBC 3.36 (*)    Hemoglobin 11.0 (*)    HCT 33.7 (*)    MCV 100.3 (*)    Neutro Abs 8.0 (*)    Monocytes Absolute 1.2 (*)    All other components within normal limits  BASIC METABOLIC PANEL - Abnormal; Notable for the following components:   Potassium 3.3 (*)    Glucose, Bld 103 (*)    Calcium 8.2 (*)    All other components within normal limits  URINE CULTURE  URINALYSIS, ROUTINE W REFLEX MICROSCOPIC    EKG None  Radiology CT Renal Stone Study  Result Date: 05/14/2020 CLINICAL DATA:  Right flank pain and urinary incontinence for 2 weeks. EXAM: CT ABDOMEN AND PELVIS WITHOUT CONTRAST TECHNIQUE: Multidetector CT imaging of the abdomen and pelvis was performed following the standard protocol without IV contrast. COMPARISON:  None. FINDINGS: Lower chest: Tiny right pleural effusion noted. Hepatobiliary: No mass visualized on this unenhanced exam. Gallbladder is unremarkable. No evidence of biliary ductal dilatation. Pancreas: No mass or inflammatory process visualized on this  unenhanced exam. Spleen:  Within normal limits in size. Adrenals/Urinary tract: No evidence of urolithiasis. Mild left renal pelvicaliectasis is seen, however there is no evidence of ureteral calculi. Urinary bladder is nearly completely empty with Foley catheter in place. Stomach/Bowel: No evidence of obstruction, inflammatory process, or abnormal fluid collections. Normal appendix visualized. Diverticulosis is seen mainly involving the sigmoid colon, however there is no evidence of diverticulitis. Vascular/Lymphatic: No pathologically enlarged lymph nodes identified. No evidence of  abdominal aortic aneurysm. Aortic atherosclerotic calcification noted. Reproductive:  Mild to moderate prostate enlargement is seen. Other: Mild diffuse mesenteric and body wall edema noted. No evidence of ascites. Musculoskeletal:  No suspicious bone lesions identified. IMPRESSION: Mild left renal pelvicaliectasis. No ureteral calculi or other obstructing etiology visualized. Mild to moderate prostate enlargement. Colonic diverticulosis, without radiographic evidence of diverticulitis. Tiny right pleural effusion and diffuse mesenteric and body wall edema, consistent with 3rd spacing. Aortic Atherosclerosis (ICD10-I70.0). Electronically Signed   By: Danae Orleans M.D.   On: 05/14/2020 04:29    Procedures Procedures (including critical care time)  Medications Ordered in ED Medications - No data to display  ED Course  I have reviewed the triage vital signs and the nursing notes.  Pertinent labs & imaging results that were available during my care of the patient were reviewed by me and considered in my medical decision making (see chart for details).  Clinical Course as of 05/14/20 0439  Sun May 14, 2020  7989 Patient with post void residual of greater than 1 L.  Foley catheter placed with approximately 1200 cc of clear urine evacuated from the bladder.  Suspect overflow incontinence.  Lab work obtained and reviewed.   Creatinine slightly elevated above baseline to 1.11.  Will place on Flomax and provide with urology follow-up. [CH]    Clinical Course User Index [CH] Arfa Lamarca, Mayer Masker, MD   MDM Rules/Calculators/A&P                          Patient presents with urinary incontinence.  He is overall nontoxic and vital signs are reassuring.  He was able to void 300 cc.  Requested post void residual.  Greater than 1 L post void residual.  Foley catheter was placed.  Presumed overflow incontinence.  Reports history of BPH.  Given his abdominal pain, will obtain CT scan of the abdomen to rule out kidney stone or other obstructive or contributing process.  CT scan does not show any obstructing stone.  He does have some mild to moderate prostate enlargement.  He also has some additional incidental findings.  Recommend urology follow-up.  Will place on Flomax.  No evidence of UTI based on urinalysis.  After history, exam, and medical workup I feel the patient has been appropriately medically screened and is safe for discharge home. Pertinent diagnoses were discussed with the patient. Patient was given return precautions.  Final Clinical Impression(s) / ED Diagnoses Final diagnoses:  Urinary retention  Overflow incontinence    Rx / DC Orders ED Discharge Orders         Ordered    tamsulosin (FLOMAX) 0.4 MG CAPS capsule  Daily        05/14/20 0348           Shon Baton, MD 05/14/20 [phone]

## 2020-05-14 NOTE — ED Notes (Addendum)
approx urine drained after foley placement.

## 2020-05-15 LAB — URINE CULTURE: Culture: NO GROWTH

## 2020-05-17 ENCOUNTER — Emergency Department (HOSPITAL_BASED_OUTPATIENT_CLINIC_OR_DEPARTMENT_OTHER)
Admission: EM | Admit: 2020-05-17 | Discharge: 2020-05-17 | Disposition: A | Discharge status: Home / Self Care | Payer: Medicare Other | Attending: Emergency Medicine | Admitting: Emergency Medicine

## 2020-05-17 ENCOUNTER — Encounter (HOSPITAL_BASED_OUTPATIENT_CLINIC_OR_DEPARTMENT_OTHER): Payer: Self-pay

## 2020-05-17 ENCOUNTER — Other Ambulatory Visit: Payer: Self-pay

## 2020-05-17 DIAGNOSIS — T839XXA Unspecified complication of genitourinary prosthetic device, implant and graft, initial encounter: Secondary | ICD-10-CM

## 2020-05-17 DIAGNOSIS — R319 Hematuria, unspecified: Secondary | ICD-10-CM | POA: Diagnosis not present

## 2020-05-17 DIAGNOSIS — F17210 Nicotine dependence, cigarettes, uncomplicated: Secondary | ICD-10-CM | POA: Diagnosis not present

## 2020-05-17 DIAGNOSIS — Z7982 Long term (current) use of aspirin: Secondary | ICD-10-CM | POA: Diagnosis not present

## 2020-05-17 DIAGNOSIS — T83091A Other mechanical complication of indwelling urethral catheter, initial encounter: Secondary | ICD-10-CM | POA: Diagnosis not present

## 2020-05-17 LAB — CBC WITH DIFFERENTIAL/PLATELET
Abs Immature Granulocytes: 0.02 10*3/uL (ref 0.00–0.07)
Basophils Absolute: 0 10*3/uL (ref 0.0–0.1)
Basophils Relative: 1 %
Eosinophils Absolute: 0.4 10*3/uL (ref 0.0–0.5)
Eosinophils Relative: 6 %
HCT: 31.4 % — ABNORMAL LOW (ref 39.0–52.0)
Hemoglobin: 10.5 g/dL — ABNORMAL LOW (ref 13.0–17.0)
Immature Granulocytes: 0 %
Lymphocytes Relative: 16 %
Lymphs Abs: 1.2 10*3/uL (ref 0.7–4.0)
MCH: 33 pg (ref 26.0–34.0)
MCHC: 33.4 g/dL (ref 30.0–36.0)
MCV: 98.7 fL (ref 80.0–100.0)
Monocytes Absolute: 0.8 10*3/uL (ref 0.1–1.0)
Monocytes Relative: 11 %
Neutro Abs: 5 10*3/uL (ref 1.7–7.7)
Neutrophils Relative %: 66 %
Platelets: 324 10*3/uL (ref 150–400)
RBC: 3.18 MIL/uL — ABNORMAL LOW (ref 4.22–5.81)
RDW: 12.3 % (ref 11.5–15.5)
WBC: 7.5 10*3/uL (ref 4.0–10.5)
nRBC: 0 % (ref 0.0–0.2)

## 2020-05-17 LAB — BASIC METABOLIC PANEL
Anion gap: 9 (ref 5–15)
BUN: 20 mg/dL (ref 8–23)
CO2: 26 mmol/L (ref 22–32)
Calcium: 8.1 mg/dL — ABNORMAL LOW (ref 8.9–10.3)
Chloride: 101 mmol/L (ref 98–111)
Creatinine, Ser: 0.84 mg/dL (ref 0.61–1.24)
GFR, Estimated: >60 mL/min (ref >60)
Glucose, Bld: 97 mg/dL (ref 70–99)
Potassium: 3.7 mmol/L (ref 3.5–5.1)
Sodium: 136 mmol/L (ref 135–145)

## 2020-05-17 NOTE — ED Provider Notes (Signed)
[facility] EMERGENCY DEPARTMENT Provider Note   CSN: [id number] Arrival date & time: 05/17/20  1253     History Chief Complaint  Patient presents with  . Foley Bag Leaking    Casey Ashley. is a 74 y.o. male.  Patient presents chief complaint of leaking from his Foley catheter bag.  Apparently noticed a hole in his Foley catheter leg bag and presents to the ER for evaluation.  He also states that urine appeared bloody again earlier today although at this time he appears to be clearing up.  Denies any back pain or abdominal pain no fever no chills no vomiting no cough no diarrhea.  He had a urine Foley catheter placed just a few weeks ago due to urine retention. He has an appointment with urology in 2 weeks.         Past Medical History:  Diagnosis Date  . Allergy   . Arthritis   . Depression   . GERD (gastroesophageal reflux disease)   . Hay fever   . Heart murmur   . Hyperlipidemia     Patient Active Problem List   Diagnosis Date Noted  . OTHER DYSPHAGIA 09/11/2009    Past Surgical History:  Procedure Laterality Date  . ESOPHAGOGASTRODUODENOSCOPY N/A 09/01/2013   Procedure: ESOPHAGOGASTRODUODENOSCOPY (EGD);  Surgeon: Hilarie Fredrickson, MD;  Location: [facility];  Service: Endoscopy;  Laterality: N/A;  . EYE SURGERY    . KNEE SURGERY         Family History  Problem Relation Age of Onset  . Arthritis Father   . Cancer Father        Colon  . Heart disease Father   . Arthritis Brother   . Emphysema Maternal Aunt     Social History   Tobacco Use  . Smoking status: Current Every Day Smoker    Packs/day: 1.25    Years: 30.00    Pack years: 37.50    Types: Cigarettes  . Smokeless tobacco: Never Used  Substance Use Topics  . Alcohol use: Yes    Alcohol/week: 0.0 standard drinks    Home Medications Prior to Admission medications   Medication Sig Start Date End Date Taking? Authorizing Provider  aspirin EC 81 MG tablet Take 81 mg by mouth  daily.    [provider]  levocetirizine (XYZAL) 5 MG tablet Take 1 tablet (5 mg total) by mouth every evening. 03/16/19   Sharlene Dory, DO  meloxicam (MOBIC) 15 MG tablet Take 1 tablet (15 mg total) daily by mouth. TAKE WITH MEALS 04/05/17   Street, [place], PA-C  Multiple Vitamins-Minerals (MULTIVITAMIN WITH MINERALS) tablet Take 1 tablet by mouth daily.    [provider]  omeprazole (PRILOSEC) 20 MG capsule TAKE 1 CAPSULE BY MOUTH EVERY DAY 07/07/19   Sharlene Dory, DO  tamsulosin (FLOMAX) 0.4 MG CAPS capsule TAKE 1 CAPSULE BY MOUTH EVERY DAY AFTER SUPPER 07/07/19   Wendling, Jilda Roche, DO  tamsulosin (FLOMAX) 0.4 MG CAPS capsule Take 1 capsule (0.4 mg total) by mouth daily. 05/14/20   Horton, Mayer Masker, MD    Allergies    Patient has no known allergies.  Review of Systems   Review of Systems  Constitutional: Negative for fever.  HENT: Negative for ear pain and sore throat.   Eyes: Negative for pain.  Respiratory: Negative for cough.   Cardiovascular: Negative for chest pain.  Gastrointestinal: Negative for abdominal pain.  Genitourinary: Negative for flank pain.  Musculoskeletal: Negative  for back pain.  Skin: Negative for color change and rash.  Neurological: Negative for syncope.  All other systems reviewed and are negative.   Physical Exam Updated Vital Signs BP 132/64 (BP Location: Left Arm)   Pulse 85   Temp 98.6 F (37 C) (Oral)   Resp 18   Ht 5\' 9"  (1.753 m)   Wt 68 kg   SpO2 100%   BMI 22.15 kg/m   Physical Exam Constitutional:      General: He is not in acute distress.    Appearance: He is well-developed.  HENT:     Head: Normocephalic.     Mouth/Throat:     Mouth: Mucous membranes are moist.  Cardiovascular:     Rate and Rhythm: Normal rate.  Pulmonary:     Effort: Pulmonary effort is normal.  Abdominal:     Palpations: Abdomen is soft.     Tenderness: There is no right CVA tenderness or left CVA  tenderness.  Musculoskeletal:     Right lower leg: No edema.     Left lower leg: No edema.  Skin:    General: Skin is warm.     Capillary Refill: Capillary refill takes less than 2 seconds.  Neurological:     General: No focal deficit present.     Mental Status: He is alert.     ED Results / Procedures / Treatments   Labs (all labs ordered are listed, but only abnormal results are displayed) Labs Reviewed  CBC WITH DIFFERENTIAL/PLATELET - Abnormal; Notable for the following components:      Result Value   RBC 3.18 (*)    Hemoglobin 10.5 (*)    HCT 31.4 (*)    All other components within normal limits  BASIC METABOLIC PANEL - Abnormal; Notable for the following components:   Calcium 8.1 (*)    All other components within normal limits    EKG None  Radiology No results found.  Procedures Procedures (including critical care time)  Medications Ordered in ED Medications - No data to display  ED Course  I have reviewed the triage vital signs and the nursing notes.  Pertinent labs & imaging results that were available during my care of the patient were reviewed by me and considered in my medical decision making (see chart for details).    MDM Rules/Calculators/A&P                          Labs unremarkable.  Hemoglobin mildly anemic.  Vital signs stable.  Foley bag was changed successfully.  Recommending immediate return if he has fevers chills or pain or difficulty urinating.  Otherwise advised him to call his urologist office in 2 or 3 days. Final Clinical Impression(s) / ED Diagnoses Final diagnoses:  None    Rx / DC Orders ED Discharge Orders    None       , MD 05/17/20 1419

## 2020-05-17 NOTE — ED Notes (Addendum)
Urine less red in color since patient arrived, now pink tinged. NAD, denies dizziness/SOB. Foley bag changed.

## 2020-05-17 NOTE — ED Triage Notes (Signed)
Pt arrives stating foley bag is leaking. Small cuts noted to bottom of bag, foley appears to be draining well, red colored urine noted in tubing and bag. Pt also concerned about coloring of urine.

## 2020-05-17 NOTE — Discharge Instructions (Addendum)
Call your primary care doctor or specialist as discussed in the next 2-3 days.   Return immediately back to the ER if:  Your symptoms worsen within the next 12-24 hours. You develop new symptoms such as new fevers, persistent vomiting, new pain, shortness of breath, or new weakness or numbness, or if you have any other concerns.  

## 2020-05-17 NOTE — ED Notes (Signed)
Cab called for patient for ride back to [facility]

## 2020-05-24 DIAGNOSIS — R351 Nocturia: Secondary | ICD-10-CM | POA: Diagnosis not present

## 2020-05-24 DIAGNOSIS — R3914 Feeling of incomplete bladder emptying: Secondary | ICD-10-CM | POA: Diagnosis not present

## 2020-05-24 DIAGNOSIS — R3912 Poor urinary stream: Secondary | ICD-10-CM | POA: Diagnosis not present

## 2020-05-24 DIAGNOSIS — R6889 Other general symptoms and signs: Secondary | ICD-10-CM | POA: Diagnosis not present

## 2020-06-02 DIAGNOSIS — R338 Other retention of urine: Secondary | ICD-10-CM | POA: Diagnosis not present

## 2020-06-02 DIAGNOSIS — R6889 Other general symptoms and signs: Secondary | ICD-10-CM | POA: Diagnosis not present

## 2020-06-05 ENCOUNTER — Ambulatory Visit: Payer: Medicaid Other | Admitting: Nurse Practitioner

## 2020-06-05 ENCOUNTER — Encounter: Payer: Self-pay | Admitting: Nurse Practitioner

## 2020-06-05 ENCOUNTER — Telehealth: Payer: Self-pay | Admitting: Nurse Practitioner

## 2020-06-05 NOTE — Telephone Encounter (Signed)
Dismissal letter being sent.

## 2020-06-05 NOTE — Telephone Encounter (Signed)
ok 

## 2020-06-07 ENCOUNTER — Telehealth: Payer: Self-pay

## 2020-06-07 NOTE — Telephone Encounter (Signed)
Pt called requesting to reschedule new pt appt. Pt was advised by front office he was dismissed. Pt requested to speak to team lead. I spoke with him in regard to 2 cancellations with Dr. Doreene Burke and 2 no shows with Alysia Penna, NP and advised that we are unable to reschedule due to above and in addition to limited availability within the practice due to the COVID-19 pandemic. Pt stated he had multiple health issues and I apologized for the situation.

## 2020-06-14 DIAGNOSIS — R6889 Other general symptoms and signs: Secondary | ICD-10-CM | POA: Diagnosis not present

## 2020-06-14 DIAGNOSIS — R338 Other retention of urine: Secondary | ICD-10-CM | POA: Diagnosis not present

## 2020-06-27 LAB — PSA SCREENING: PSA: <0.01 ng/mL (ref 0.00–4.00)

## 2020-06-28 LAB — TESTOSTERONE, FREE, TOTAL
Sex Hormone Binding: 66 nmol/L (ref 11–80)
Testosterone, Free: 1.6 pg/mL — ABNORMAL LOW (ref 47–244)
Testosterone: 14 ng/dL — ABNORMAL LOW (ref 220–1000)

## 2020-07-16 ENCOUNTER — Emergency Department (HOSPITAL_COMMUNITY)
Admission: EM | Admit: 2020-07-16 | Discharge: 2020-07-17 | Disposition: A | Discharge status: Home / Self Care | Payer: Medicare Other | Attending: Emergency Medicine | Admitting: Emergency Medicine

## 2020-07-16 ENCOUNTER — Other Ambulatory Visit: Payer: Self-pay

## 2020-07-16 DIAGNOSIS — Z79899 Other long term (current) drug therapy: Secondary | ICD-10-CM | POA: Insufficient documentation

## 2020-07-16 DIAGNOSIS — R339 Retention of urine, unspecified: Secondary | ICD-10-CM | POA: Diagnosis not present

## 2020-07-16 DIAGNOSIS — F17210 Nicotine dependence, cigarettes, uncomplicated: Secondary | ICD-10-CM | POA: Insufficient documentation

## 2020-07-16 DIAGNOSIS — Z7982 Long term (current) use of aspirin: Secondary | ICD-10-CM | POA: Insufficient documentation

## 2020-07-16 LAB — URINALYSIS, ROUTINE W REFLEX MICROSCOPIC
Bacteria, UA: NONE SEEN
Bilirubin Urine: NEGATIVE
Glucose, UA: NEGATIVE mg/dL
Ketones, ur: NEGATIVE mg/dL
Leukocytes,Ua: NEGATIVE
Nitrite: NEGATIVE
Protein, ur: NEGATIVE mg/dL
Specific Gravity, Urine: 1.011 (ref 1.005–1.030)
pH: 6 (ref 5.0–8.0)

## 2020-07-16 NOTE — ED Notes (Signed)
Catheter replaced with 49fr latex catheter

## 2020-07-16 NOTE — ED Triage Notes (Signed)
Pt came in with c/o decreased urine output and urinary retention. Bladder scan done and 925. Pt states he got the catheter put in 35 days ago for urinary retention. He was supposed to get it taken out last Tue, but he had a fever and was not able to.

## 2020-07-16 NOTE — ED Provider Notes (Signed)
[facility]-EMERGENCY DEPT Provider Note   CSN: [id number] Arrival date & time: 07/16/20  1945     History Chief Complaint  Patient presents with  . Urinary Retention    Casey Mccorkel. is a 75 y.o. male.  The history is provided by the patient and medical records.   Casey Ashley. is a 75 y.o. male who presents to the Emergency Department complaining of urinary retention. He has a history of her urinary retention and has an indwelling Foley catheter in place. He was supposed to go to urology on Tuesday of last week but did not get to the appointment due to a fever. He only had the fever that day and had no additional symptoms. He has been feeling well since that time. Today he noticed around noon that the catheter was no longer draining. He developed burning sensation at the catheter site.   he denies any associated chest pain, shortness of breath, nausea, vomiting, diarrhea. He does have constipation. He took Ex-Lax for that today.  Past Medical History:  Diagnosis Date  . Allergy   . Arthritis   . Depression   . GERD (gastroesophageal reflux disease)   . Hay fever   . Heart murmur   . Hyperlipidemia     Patient Active Problem List   Diagnosis Date Noted  . OTHER DYSPHAGIA 09/11/2009    Past Surgical History:  Procedure Laterality Date  . ESOPHAGOGASTRODUODENOSCOPY N/A 09/01/2013   Procedure: ESOPHAGOGASTRODUODENOSCOPY (EGD);  Surgeon: Hilarie Fredrickson, MD;  Location: [facility];  Service: Endoscopy;  Laterality: N/A;  . EYE SURGERY    . KNEE SURGERY         Family History  Problem Relation Age of Onset  . Arthritis Father   . Cancer Father        Colon  . Heart disease Father   . Arthritis Brother   . Emphysema Maternal Aunt     Social History   Tobacco Use  . Smoking status: Current Every Day Smoker    Packs/day: 1.25    Years: 30.00    Pack years: 37.50    Types: Cigarettes  . Smokeless tobacco: Never Used  Substance Use  Topics  . Alcohol use: Yes    Alcohol/week: 0.0 standard drinks    Home Medications Prior to Admission medications   Medication Sig Start Date End Date Taking? Authorizing Provider  aspirin EC 81 MG tablet Take 81 mg by mouth daily.    [provider]  levocetirizine (XYZAL) 5 MG tablet Take 1 tablet (5 mg total) by mouth every evening. 03/16/19   Sharlene Dory, DO  meloxicam (MOBIC) 15 MG tablet Take 1 tablet (15 mg total) daily by mouth. TAKE WITH MEALS 04/05/17   Street, [place], PA-C  Multiple Vitamins-Minerals (MULTIVITAMIN WITH MINERALS) tablet Take 1 tablet by mouth daily.    [provider]  omeprazole (PRILOSEC) 20 MG capsule TAKE 1 CAPSULE BY MOUTH EVERY DAY 07/07/19   Sharlene Dory, DO  tamsulosin (FLOMAX) 0.4 MG CAPS capsule TAKE 1 CAPSULE BY MOUTH EVERY DAY AFTER SUPPER 07/07/19   Wendling, Jilda Roche, DO  tamsulosin (FLOMAX) 0.4 MG CAPS capsule Take 1 capsule (0.4 mg total) by mouth daily. 05/14/20   Horton, Mayer Masker, MD    Allergies    Patient has no known allergies.  Review of Systems   Review of Systems  All other systems reviewed and are negative.   Physical Exam Updated Vital Signs  BP (!) 142/80 (BP Location: Left Arm)   Pulse 84   Temp 98.1 F (36.7 C) (Oral)   Resp 20   Ht 5\' 9"  (1.753 m)   Wt 68 kg   SpO2 99%   BMI 22.15 kg/m   Physical Exam Vitals and nursing note reviewed.  Constitutional:      Appearance: He is well-developed and well-nourished.  HENT:     Head: Normocephalic and atraumatic.  Cardiovascular:     Rate and Rhythm: Normal rate and regular rhythm.  Pulmonary:     Effort: Pulmonary effort is normal. No respiratory distress.  Abdominal:     Palpations: Abdomen is soft.     Tenderness: There is no abdominal tenderness. There is no guarding or rebound.  Genitourinary:    Comments: 81 French Foley catheter in place. There is yellow urine in the catheter bag. Musculoskeletal:         General: No tenderness or edema.  Skin:    General: Skin is warm and dry.  Neurological:     Mental Status: He is alert and oriented to person, place, and time.  Psychiatric:        Mood and Affect: Mood and affect normal.        Behavior: Behavior normal.     ED Results / Procedures / Treatments   Labs (all labs ordered are listed, but only abnormal results are displayed) Labs Reviewed  URINALYSIS, ROUTINE W REFLEX MICROSCOPIC - Abnormal; Notable for the following components:      Result Value   Hgb urine dipstick SMALL (*)    All other components within normal limits  URINE CULTURE    EKG None  Radiology No results found.  Procedures Procedures   Medications Ordered in ED Medications - No data to display  ED Course  I have reviewed the triage vital signs and the nursing notes.  Pertinent labs & imaging results that were available during my care of the patient were reviewed by me and considered in my medical decision making (see chart for details).    MDM Rules/Calculators/A&P                         patient with history of urinary retention with indwelling Foley catheter. He was supposed to have it removed on Tuesday but did not go to the appointment due to feeling unwell that day. He has been well since that time. He is currently had his catheter and for about 35 days. At the catheter was replaced in the emergency department with a 16 French Foley catheter. After replacement he had relief of his symptoms. He had approximately 900 mL of urine output. UA is not consistent with UTI, will send cultures so urology has these available in the system. At this point would not treat for infection given his current symptoms if the cultures return back positive. Discussed with patient catheter care, outpatient follow-up and return precautions. Final Clinical Impression(s) / ED Diagnoses Final diagnoses:  Urinary retention    Rx / DC Orders ED Discharge Orders    None        Monday, MD 07/16/20 2202

## 2020-07-18 LAB — URINE CULTURE: Culture: NO GROWTH

## 2020-08-01 ENCOUNTER — Ambulatory Visit: Payer: Medicare Other | Admitting: Internal Medicine

## 2020-08-10 ENCOUNTER — Encounter (HOSPITAL_COMMUNITY): Payer: Self-pay

## 2020-08-10 ENCOUNTER — Other Ambulatory Visit: Payer: Self-pay

## 2020-08-10 ENCOUNTER — Emergency Department (HOSPITAL_COMMUNITY)
Admission: EM | Admit: 2020-08-10 | Discharge: 2020-08-11 | Disposition: A | Discharge status: Home / Self Care | Payer: Medicare Other | Attending: Emergency Medicine | Admitting: Emergency Medicine

## 2020-08-10 DIAGNOSIS — T83091A Other mechanical complication of indwelling urethral catheter, initial encounter: Secondary | ICD-10-CM | POA: Diagnosis not present

## 2020-08-10 DIAGNOSIS — Z7982 Long term (current) use of aspirin: Secondary | ICD-10-CM | POA: Diagnosis not present

## 2020-08-10 DIAGNOSIS — Y69 Unspecified misadventure during surgical and medical care: Secondary | ICD-10-CM | POA: Insufficient documentation

## 2020-08-10 DIAGNOSIS — F17210 Nicotine dependence, cigarettes, uncomplicated: Secondary | ICD-10-CM | POA: Insufficient documentation

## 2020-08-10 NOTE — ED Notes (Signed)
Attempted to irrigate foley. Nothing came out from catheter.

## 2020-08-10 NOTE — ED Triage Notes (Signed)
Pt reports urinary retention since this afternoon. Catheter placed 07/16/20.

## 2020-08-10 NOTE — ED Provider Notes (Signed)
[facility]-EMERGENCY DEPT Provider Note: Lowella Dell, MD, FACEP  CSN: [id number] MRN: [id number] ARRIVAL: 08/10/20 at 1935 ROOM: WA16/WA16   CHIEF COMPLAINT  Urinary Retention   HISTORY OF PRESENT ILLNESS  08/10/20 11:04 PM Casey Ashley. is a 75 y.o. male who has indwelling Foley catheter.  He states the catheter has not been draining since about noon today.  He feels a severe need to urinate and rates associated pain in his suprapubic region is a 4 out of 10.  He is also constipated unable to void his bowels.  His nurse attempted to irrigate his Foley without relief.   Past Medical History:  Diagnosis Date  . Allergy   . Arthritis   . Depression   . GERD (gastroesophageal reflux disease)   . Hay fever   . Heart murmur   . Hyperlipidemia     Past Surgical History:  Procedure Laterality Date  . ESOPHAGOGASTRODUODENOSCOPY N/A 09/01/2013   Procedure: ESOPHAGOGASTRODUODENOSCOPY (EGD);  Surgeon: Hilarie Fredrickson, MD;  Location: [facility];  Service: Endoscopy;  Laterality: N/A;  . EYE SURGERY    . KNEE SURGERY      Family History  Problem Relation Age of Onset  . Arthritis Father   . Cancer Father        Colon  . Heart disease Father   . Arthritis Brother   . Emphysema Maternal Aunt     Social History   Tobacco Use  . Smoking status: Current Every Day Smoker    Packs/day: 1.25    Years: 30.00    Pack years: 37.50    Types: Cigarettes  . Smokeless tobacco: Never Used  Substance Use Topics  . Alcohol use: Yes    Alcohol/week: 0.0 standard drinks    Prior to Admission medications   Medication Sig Start Date End Date Taking? Authorizing Provider  aspirin EC 81 MG tablet Take 81 mg by mouth daily.    [provider]  levocetirizine (XYZAL) 5 MG tablet Take 1 tablet (5 mg total) by mouth every evening. 03/16/19   Sharlene Dory, DO  meloxicam (MOBIC) 15 MG tablet Take 1 tablet (15 mg total) daily by mouth. TAKE WITH MEALS 04/05/17   Street,  [place], PA-C  Multiple Vitamins-Minerals (MULTIVITAMIN WITH MINERALS) tablet Take 1 tablet by mouth daily.    [provider]  omeprazole (PRILOSEC) 20 MG capsule TAKE 1 CAPSULE BY MOUTH EVERY DAY 07/07/19   Sharlene Dory, DO  tamsulosin (FLOMAX) 0.4 MG CAPS capsule TAKE 1 CAPSULE BY MOUTH EVERY DAY AFTER SUPPER 07/07/19   Wendling, Jilda Roche, DO  tamsulosin (FLOMAX) 0.4 MG CAPS capsule Take 1 capsule (0.4 mg total) by mouth daily. 05/14/20   Horton, Mayer Masker, MD    Allergies Patient has no known allergies.   REVIEW OF SYSTEMS  Negative except as noted here or in the History of Present Illness.   PHYSICAL EXAMINATION  Initial Vital Signs Blood pressure (!) 183/78, pulse 84, temperature 98.2 F (36.8 C), temperature source Oral, resp. rate 17, height 5\' 9"  (1.753 m), weight 68 kg, SpO2 100 %.  Examination General: Well-developed, well-nourished male in no acute distress; appearance consistent with age of record HENT: normocephalic; atraumatic Eyes: Normal appearance Neck: supple Heart: regular rate and rhythm Lungs: clear to auscultation bilaterally Abdomen: soft; distended, tender bladder; bowel sounds present GU: 16 French Foley in urethra Extremities: No deformity; full range of motion Neurologic: Awake, alert and oriented; motor function intact in all extremities and  symmetric; no facial droop Skin: Warm and dry Psychiatric: Normal mood and affect   RESULTS  Summary of this visit's results, reviewed and interpreted by myself:   EKG Interpretation  Date/Time:    Ventricular Rate:    PR Interval:    QRS Duration:   QT Interval:    QTC Calculation:   R Axis:     Text Interpretation:        Laboratory Studies: No results found for this or any previous visit (from the past 24 hour(s)). Imaging Studies: No results found.  ED COURSE and MDM  Nursing notes, initial and subsequent vitals signs, including pulse oximetry, reviewed and  interpreted by myself.  Vitals:   08/10/20 1939 08/10/20 2207 08/10/20 2230 08/10/20 2351  BP: (!) 175/87 (!) 183/78 (!) 170/95 (!) 145/69  Pulse: 93 84 84 78  Resp: 18 17 17 15   Temp: 98.2 F (36.8 C)     TempSrc: Oral     SpO2: 95% 100% 99% 100%  Weight: 68 kg     Height: 5\' 9"  (1.753 m)      Medications - No data to display  12:22 AM Foley catheter changed by nursing staff.  About 1 L of amber urine obtained.  Patient's discomfort is significantly improved.  PROCEDURES  Procedures   ED DIAGNOSES     ICD-10-CM   1. Obstruction of Foley catheter, initial encounter (HCC)  T83.091A        , MD 08/11/20 Paula Libra

## 2020-09-05 DIAGNOSIS — R338 Other retention of urine: Secondary | ICD-10-CM | POA: Diagnosis not present

## 2020-09-13 DIAGNOSIS — R3914 Feeling of incomplete bladder emptying: Secondary | ICD-10-CM | POA: Diagnosis not present

## 2020-09-13 DIAGNOSIS — R351 Nocturia: Secondary | ICD-10-CM | POA: Diagnosis not present

## 2020-09-27 ENCOUNTER — Ambulatory Visit: Payer: Medicare Other | Admitting: Internal Medicine

## 2020-09-29 DIAGNOSIS — R3914 Feeling of incomplete bladder emptying: Secondary | ICD-10-CM | POA: Diagnosis not present

## 2020-10-06 DIAGNOSIS — R3914 Feeling of incomplete bladder emptying: Secondary | ICD-10-CM | POA: Diagnosis not present

## 2020-12-04 ENCOUNTER — Ambulatory Visit: Payer: Medicare Other | Admitting: Internal Medicine

## 2020-12-06 LAB — PSA SCREENING: PSA: <0.01 ng/mL (ref 0.00–4.00)

## 2021-03-27 ENCOUNTER — Encounter: Payer: MEDICARE | Primary: Family Medicine

## 2021-03-28 ENCOUNTER — Inpatient Hospital Stay: Admit: 2021-03-28 | Payer: MEDICARE | Primary: Family Medicine

## 2021-03-28 DIAGNOSIS — I2510 Atherosclerotic heart disease of native coronary artery without angina pectoris: Secondary | ICD-10-CM

## 2021-03-28 LAB — NM MYOCARDIAL SPECT REST EXERCISE OR RX: Left Ventricular Ejection Fraction: 69

## 2021-03-28 MED ORDER — TECHNETIUM TC 99M SESTAMIBI - CARDIOLITE
Freq: Once | Status: AC | PRN
Start: 2021-03-28 — End: 2021-03-28
  Administered 2021-03-28: 12:00:00 10 via INTRAVENOUS

## 2021-03-28 MED ORDER — TECHNETIUM TC 99M SESTAMIBI - CARDIOLITE
Freq: Once | Status: AC | PRN
Start: 2021-03-28 — End: 2021-03-28
  Administered 2021-03-28: 12:00:00 30 via INTRAVENOUS

## 2021-03-28 NOTE — Progress Notes (Signed)
Exercise stress test completed with RN, nuclear tech, and physician.

## 2021-10-19 IMAGING — CT CT RENAL STONE PROTOCOL
2 of 4 series · 16 of 46 positions shown, 18 images · non-contrast
Comparison: None.

CLINICAL DATA: Right flank pain and urinary incontinence for 2
weeks.

EXAM:
CT ABDOMEN AND PELVIS WITHOUT CONTRAST
TECHNIQUE: Multidetector CT imaging of the abdomen and pelvis was performed
following the standard protocol without IV contrast.

[Series 2: axial st · axial · 0.81mm/px · z∈[-477,-102]mm · 13 of 85 slices shown, 15 images]
[im 5/85  soft-tissue]
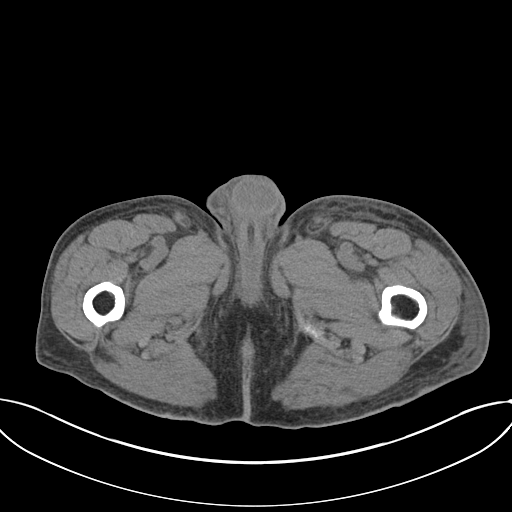
[im 5/85  bone]
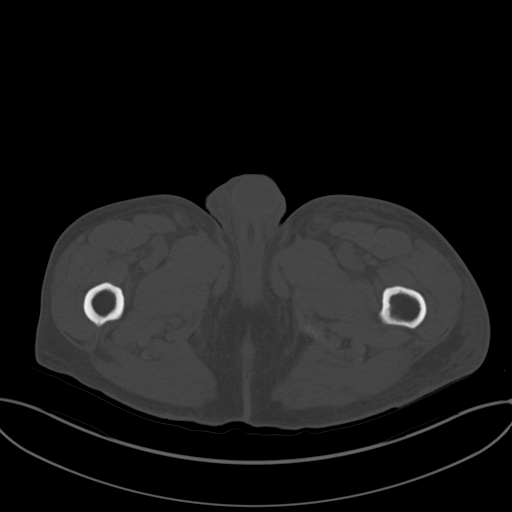
[im 13/85  soft-tissue]
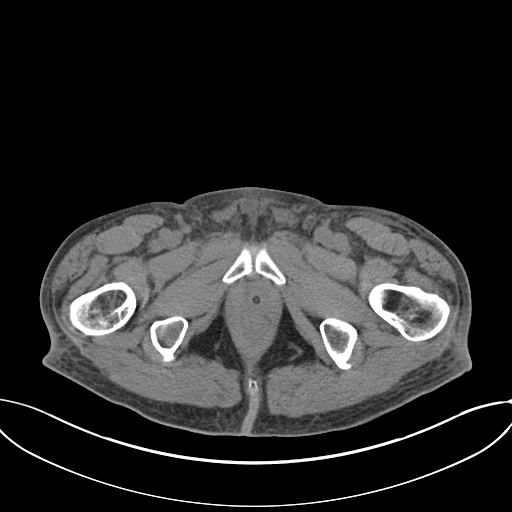
[im 17/85  soft-tissue]
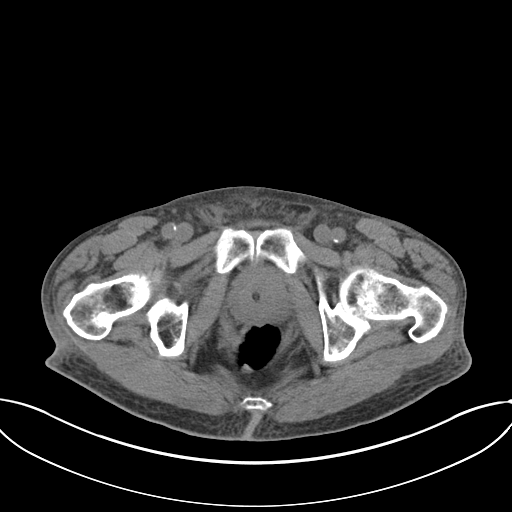
[im 26/85  soft-tissue]
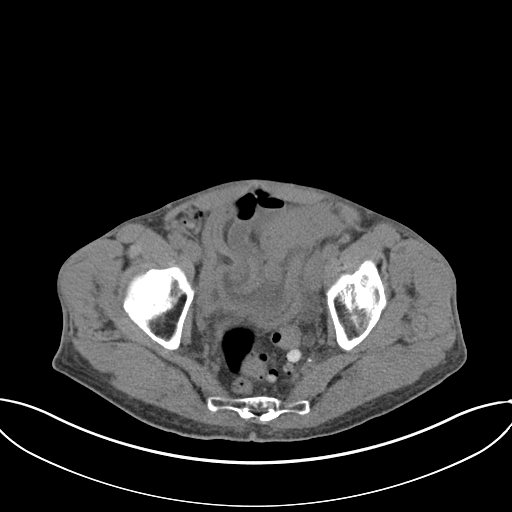
[im 30/85  soft-tissue]
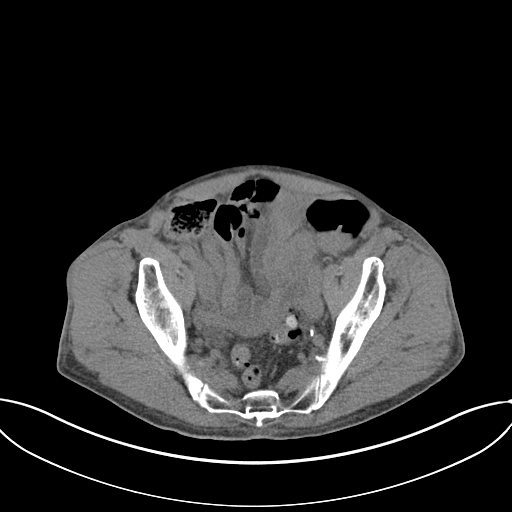
[im 38/85  soft-tissue]
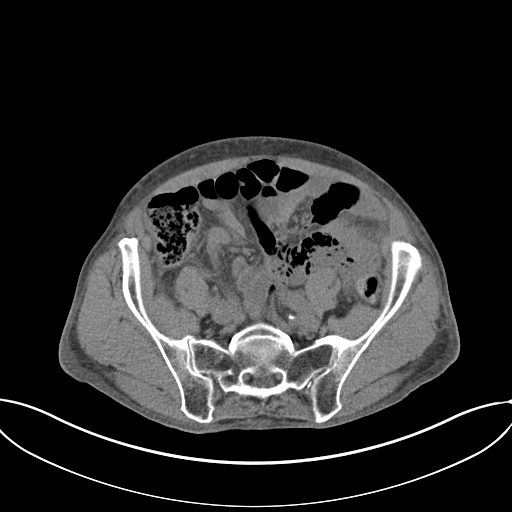
[im 43/85  soft-tissue]
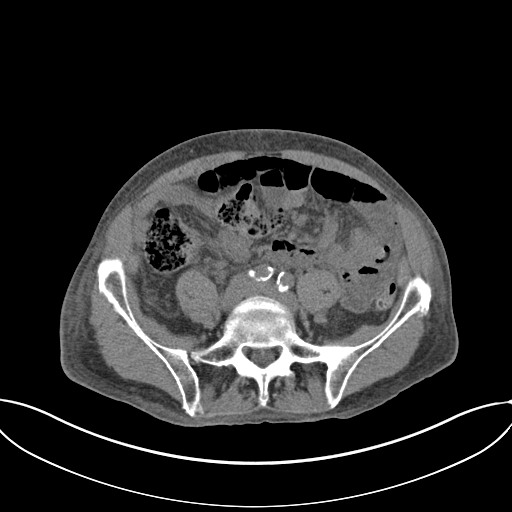
[im 47/85  soft-tissue]
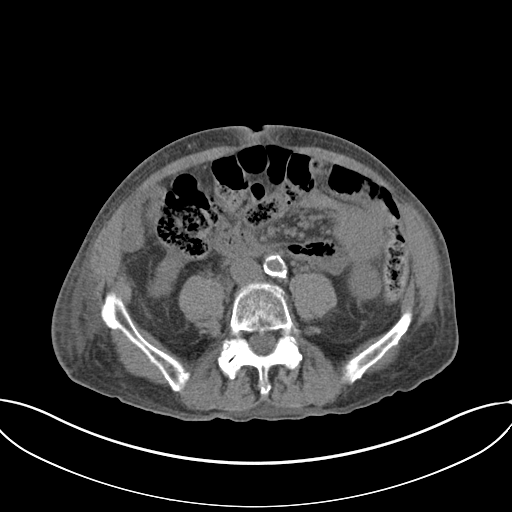
[im 55/85  soft-tissue]
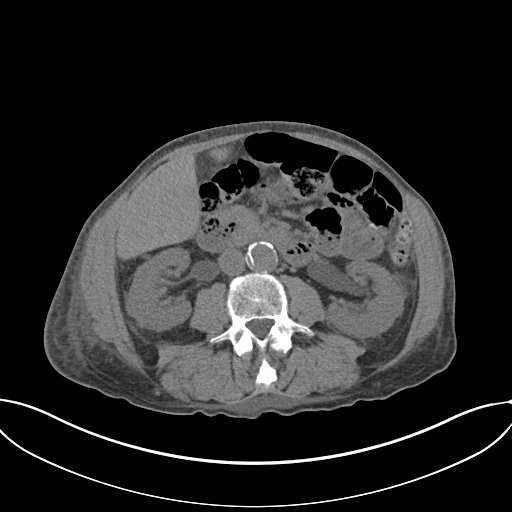
[im 55/85  bone]
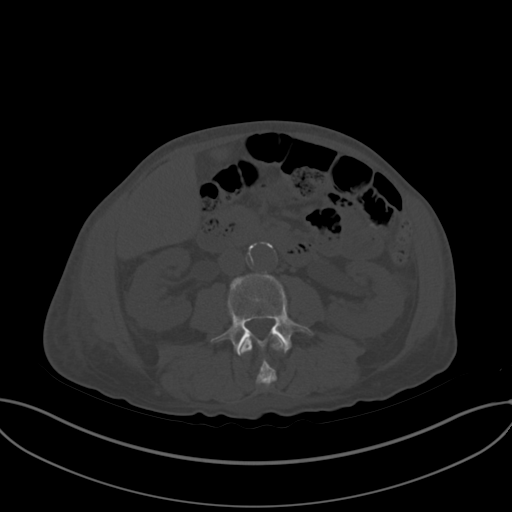
[im 59/85  soft-tissue]
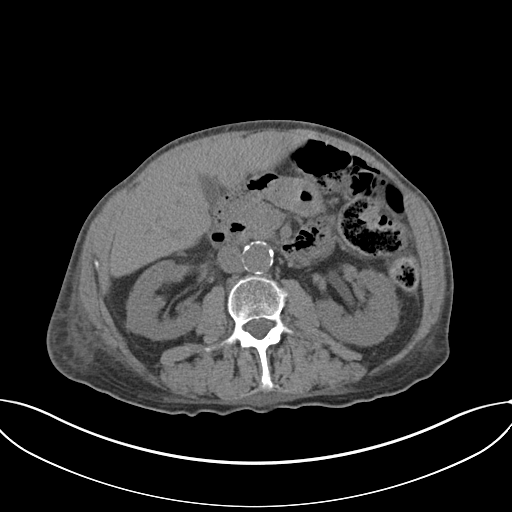
[im 68/85  soft-tissue]
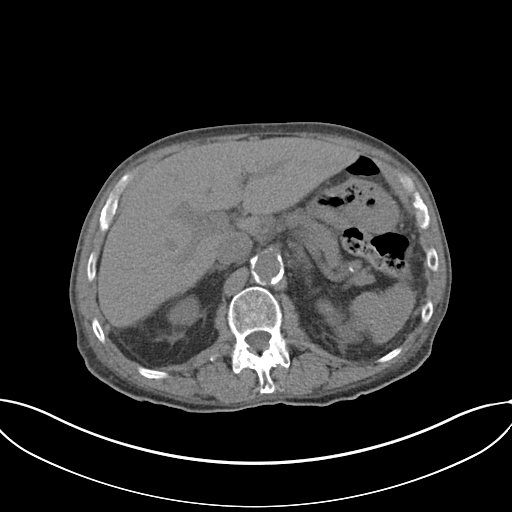
[im 72/85  soft-tissue]
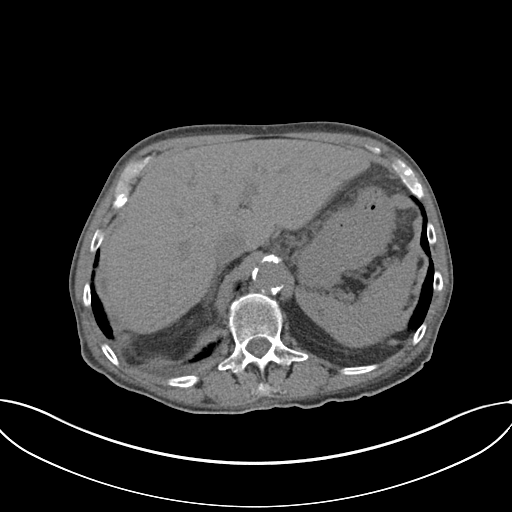
[im 80/85  soft-tissue]
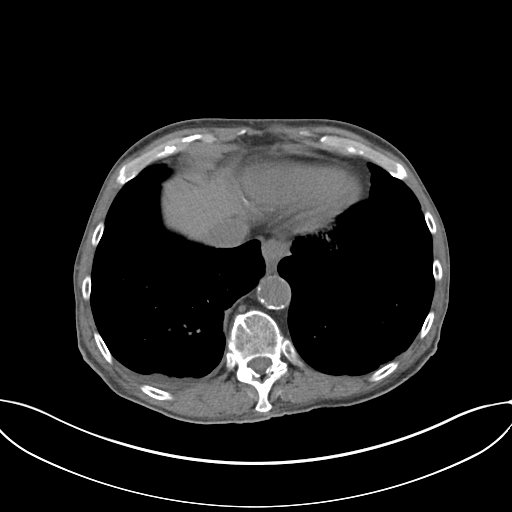

[Series 5: coronal · coronal · 0.67mm/px · 3 of 135 slices shown]
[im 45/135  soft-tissue]
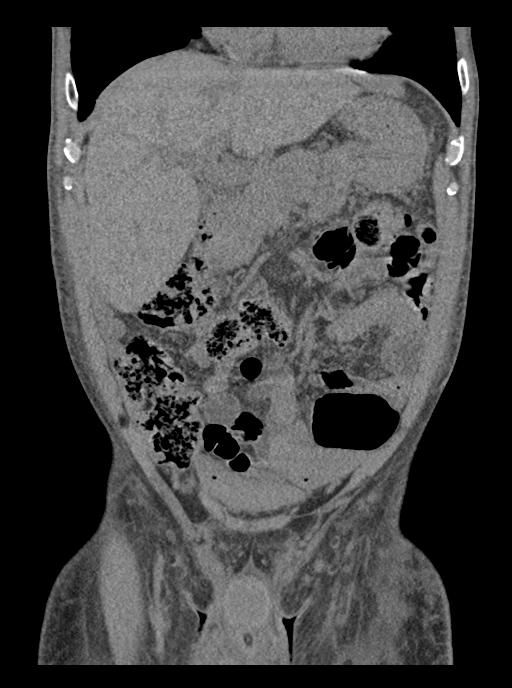
[im 60/135  soft-tissue]
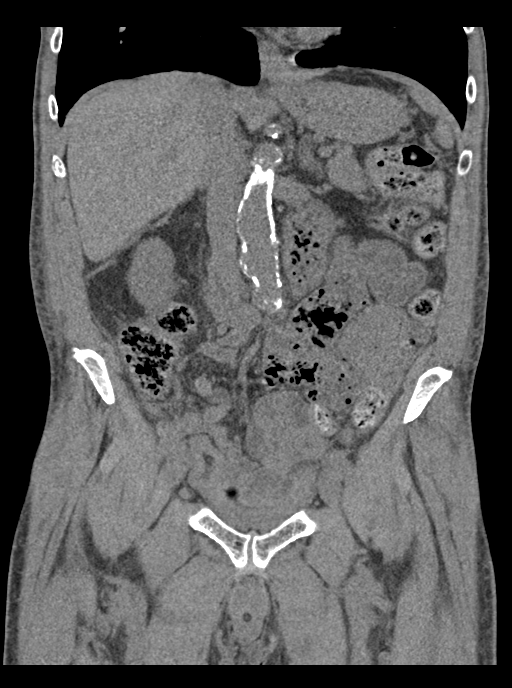
[im 75/135  soft-tissue]
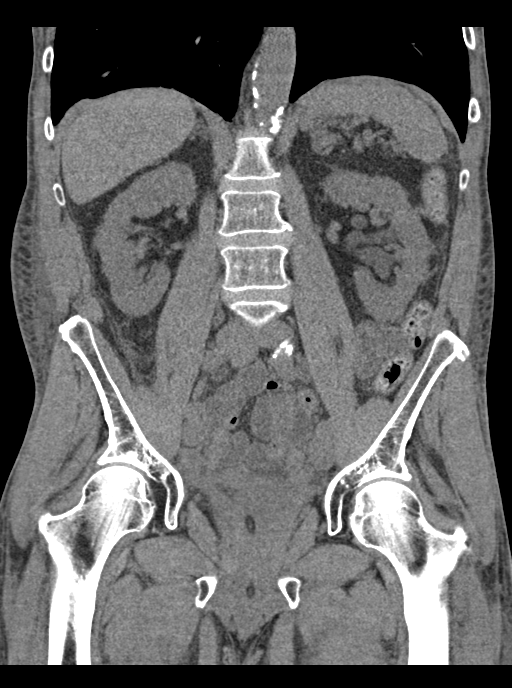

[16 of 46 positions shown; findings below may reference images not displayed]

FINDINGS: Lower chest: Tiny right pleural effusion noted.

Hepatobiliary: No mass visualized on this unenhanced exam.
Gallbladder is unremarkable. No evidence of biliary ductal
dilatation.

Pancreas: No mass or inflammatory process visualized on this
unenhanced exam.

Spleen:  Within normal limits in size.

Adrenals/Urinary tract: No evidence of urolithiasis. Mild left renal
pelvicaliectasis is seen, however there is no evidence of ureteral
calculi. Urinary bladder is nearly completely empty with Foley
catheter in place.

Stomach/Bowel: No evidence of obstruction, inflammatory process, or
abnormal fluid collections. Normal appendix visualized.
Diverticulosis is seen mainly involving the sigmoid colon, however
there is no evidence of diverticulitis.

Vascular/Lymphatic: No pathologically enlarged lymph nodes
identified. No evidence of abdominal aortic aneurysm. Aortic
atherosclerotic calcification noted.

Reproductive:  Mild to moderate prostate enlargement is seen.

Other: Mild diffuse mesenteric and body wall edema noted. No
evidence of ascites.

Musculoskeletal:  No suspicious bone lesions identified.
IMPRESSION: Mild left renal pelvicaliectasis. No ureteral calculi or other
obstructing etiology visualized.

Mild to moderate prostate enlargement.

Colonic diverticulosis, without radiographic evidence of
diverticulitis.

Tiny right pleural effusion and diffuse mesenteric and body wall
edema, consistent with 3rd spacing.

Aortic Atherosclerosis ([id number]).

## 2022-01-23 LAB — FECAL DNA COLORECTAL CANCER SCREENING (COLOGUARD): FIT-DNA (Cologuard): NEGATIVE

## 2022-04-19 LAB — PSA, DIAGNOSTIC: PSA: 0.13 ng/mL (ref 0.00–4.00)

## 2022-07-01 ENCOUNTER — Inpatient Hospital Stay: Payer: MEDICARE | Primary: Family Medicine

## 2022-07-01 ENCOUNTER — Inpatient Hospital Stay: Admit: 2022-07-01 | Payer: MEDICARE | Primary: Family Medicine

## 2022-07-01 ENCOUNTER — Encounter

## 2022-07-01 DIAGNOSIS — M79662 Pain in left lower leg: Secondary | ICD-10-CM

## 2022-07-25 LAB — PSA, DIAGNOSTIC: PSA: 0.03 ng/mL (ref 0.00–4.00)

## 2022-11-14 ENCOUNTER — Ambulatory Visit (HOSPITAL_COMMUNITY)
Admission: EM | Admit: 2022-11-14 | Discharge: 2022-11-14 | Disposition: A | Discharge status: Home / Self Care | Payer: 59 | Attending: Behavioral Health | Admitting: Behavioral Health

## 2022-11-14 DIAGNOSIS — F1090 Alcohol use, unspecified, uncomplicated: Secondary | ICD-10-CM | POA: Diagnosis not present

## 2022-11-14 DIAGNOSIS — F1290 Cannabis use, unspecified, uncomplicated: Secondary | ICD-10-CM | POA: Insufficient documentation

## 2022-11-14 DIAGNOSIS — F1510 Other stimulant abuse, uncomplicated: Secondary | ICD-10-CM

## 2022-11-14 DIAGNOSIS — F1590 Other stimulant use, unspecified, uncomplicated: Secondary | ICD-10-CM | POA: Diagnosis present

## 2022-11-14 NOTE — Discharge Instructions (Addendum)
Discharge recommendations:   Outpatient Follow up: Please see list of outpatient and inpatient residential treatment facility for treatment options.   Therapy: We recommend that patient participate in individual therapy to address mental health concerns.  Safety:   The following safety precautions should be taken:   No sharp objects. This includes scissors, razors, scrapers, and putty knives.   Chemicals should be removed and locked up.   Medications should be removed and locked up.   Weapons should be removed and locked up. This includes firearms, knives and instruments that can be used to cause injury.   The patient should abstain from use of illicit substances/drugs and abuse of any medications.  If symptoms worsen or do not continue to improve or if the patient becomes actively suicidal or homicidal then it is recommended that the patient return to the closest hospital emergency department, the [facility], or call 911 for further evaluation and treatment. National Suicide Prevention Lifeline 1-800-SUICIDE or (952)005-4771.  About 988 988 offers 24/7 access to trained crisis counselors who can help people experiencing mental health-related distress. People can call or text 988 or chat 988lifeline.org for themselves or if they are worried about a loved one who may need crisis support.   Please contact one of the following facilities to start medication management and therapy services:   [facility] [address]  [place], [place] [address] [phone]   [facility]  [address] [place], [place] [address] [phone]  [facility]  [address] [place], [place] [address] [phone]  [facility]  [address]  [place], [place] [address] [phone]  [facility]  [address] [place], [place]  [address] [phone]  [facility]  [address],  [place], [place] [address] [phone]

## 2022-11-14 NOTE — H&P (Addendum)
Behavioral Health Urgent Care Medical Screening Exam  Patient Name: Casey Ashley. MRN: [id number] Date of Evaluation: 11/14/22 Chief Complaint: "I have to quit methamphetamine use." Diagnosis:  Final diagnoses:  Methamphetamine use (HCC)    History of Present illness: Casey Ashley. is a 77 y.o. male presenting voluntarily as a walk-in to [facility] seeking medication, housing, and resources to help him with the cessation of his methamphetamine use. Sutter reports that he has been staying in the woods at [facility] for the past 5 years and it is a bad environment for him because his friends there are also meth users. He expresses interest in alternative housing options and was informed of [facility] shelters, but isn't interested in those options. He said that he has already tried with the [employer]. Patient has been using methamphetamines since the Fall of 2019. At that time he had met a friend who had offered him some meth and he reports making the "poor choice" of trying some. Since them, [name] reports that he has been "hooked" and greatly regrets his decision. His last use of meth was 3 days ago. He said that he was using about 4 grams of meth in a month, $400-$500 worth. He reports using his retirement income to purchase his drugs. He has tried numerous times in the past to quit his meth use, but the longest time he was able to abstain from use was 1 week. He said going through the withdrawal was "pure hell" with the last time being this past Winter. Today, he reports ongoing cravings for methamphetamines.  Patient describes meth "as some evil stuff" that has caused him "medical, physical, emotional, and spiritual problems." Casey Ashley reports having a 1/2 piece of paper where he has written all of the effects it has had on him. Casey Ashley reports that he used to have a [employer] and wants to be be able to smile again. Patient said that his teeth are rotten now and he has  lost several teeth as a result of continued use and poor hygiene. He has also had trouble sleeping for the past 2-3 days. He reports having cuts that haven't healed to his lower extremities for the past 3.5 years. The cuts to his right lower extremity were assessed and showed signs of healing with scabs. He said that he got the cuts from living in the woods. He also reports chronic urinary problems for which he has had a urinary catheter for the past 3 years.  He also endorses THC use, last use being 2 days ago. [name] reports that he started using THC as "an alternative" to his methamphetamine use. His alcohol use is occasional with his last use being yesterday of 2-12 ounce beers.   He appears his stated age, but is disheveled. He is calm and cooperative during the assessment. His speech is clear and coherent, but rapid with a moderate volume and tone. His thought process is organized and goal-directed and his thought content is logical. His mood is depressed and anxious and his affect is congruent with his mood. No abnormal motor activity observed. There is no evidence that he is responding to internal stimuli. No paranoid or delusional thought content. He does have tangential speech and is hard to interrupt at times. He denies suicidal and homicidal ideations. He denies auditory and visual hallucinations. Discussed options for residential rehab, but when patient learned that these programs are typically at least 28 days in length he displayed no interest.  Patient said that he knows what he needs to do, which is to attend NA meetings. He denies attending rehab in the past. He recalls a past hx of attending AA meetings as well. He was provided a list of outpatient resources and residential rehab.   The only past psychiatric history he reported is bipolar disorder from back in 1996 when he broke up with his 2nd wife. He denies currently taking any medications. He denies a family psychiatric hx.   Flowsheet  Row ED from 11/14/2022 in [facility] ED from 08/10/2020 in [facility] Emergency Department at [facility] ED from 07/16/2020 in [facility] Emergency Department at [facility]  C-SSRS RISK CATEGORY No Risk No Risk No Risk       Psychiatric Specialty Exam  Presentation  General Appearance:Disheveled   Eye Contact:Fair   Speech:Clear and Coherent   Speech Volume:Normal   Handedness:Right    Mood and Affect  Mood: Anxious   Affect: Congruent    Thought Process  Thought Processes: Coherent; Goal Directed   Descriptions of Associations:Intact   Orientation:Full (Time, Place and Person)   Thought Content:No data recorded  Diagnosis of Schizophrenia or Schizoaffective disorder in past: No data recorded  Duration of Psychotic Symptoms: No data recorded  Hallucinations:None   Ideas of Reference:None   Suicidal Thoughts:No   Homicidal Thoughts:No    Sensorium  Memory: Immediate Fair   Judgment: Fair   Insight: Fair    Art therapist  Concentration: Fair   Attention Span: Fair   Recall: [employer] of Knowledge: Fair   Language: Fair    Psychomotor Activity  Psychomotor Activity: Normal    Assets  Assets: Manufacturing systems engineer; Desire for Improvement; Financial Resources/Insurance; Leisure Time; Physical Health    Sleep  Sleep: Poor  Physical Exam: Physical Exam Constitutional:      Appearance: Normal appearance.  HENT:     Head: Normocephalic.  Cardiovascular:     Rate and Rhythm: Normal rate.  Pulmonary:     Effort: Pulmonary effort is normal.  Musculoskeletal:        General: Normal range of motion.     Cervical back: Normal range of motion.  Neurological:     Mental Status: He is alert. Mental status is at baseline.  Psychiatric:        Attention and Perception: Attention normal. He does not perceive auditory or visual hallucinations.         Mood and Affect: Mood is anxious and depressed.        Speech: Speech is tangential.        Behavior: Behavior normal. Behavior is cooperative.        Thought Content: Thought content normal. Thought content is not paranoid or delusional. Thought content does not include homicidal or suicidal ideation. Thought content does not include homicidal or suicidal plan.        Cognition and Memory: Cognition normal.        Judgment: Judgment normal.    Review of Systems  HENT: Negative.    Eyes: Negative.   Respiratory: Negative.    Cardiovascular: Negative.   Gastrointestinal: Negative.   Genitourinary:  Positive for dysuria.  Musculoskeletal: Negative.   Skin: Negative.   Neurological: Negative.   Endo/Heme/Allergies: Negative.   Psychiatric/Behavioral:  Positive for substance abuse. Negative for hallucinations and suicidal ideas. The patient is nervous/anxious and has insomnia.    Blood pressure (!) 176/79, pulse 93, temperature 98 F (36.7  C), temperature source Oral, resp. rate 18, SpO2 100 %. There is no height or weight on file to calculate BMI.  Musculoskeletal: Strength & Muscle Tone: within normal limits Gait & Station: normal Patient leans: N/A   [facility] MSE Discharge Disposition for Follow up and Recommendations: Based on my evaluation the patient does not appear to have an emergency medical condition and can be discharged with resources and follow up care in outpatient services for Substance Abuse Intensive Outpatient Program  Discussed options for residential rehab, but when patient learned that these programs are typically at least 28 days in length he displayed no interest. Patient said that he knows what he needs to do, which is to attend NA meetings. He denies attending rehab in the past. He recalls a past hx of attending AA meetings as well. He was provided a list of outpatient resources and residential rehab.   Brina Umeda L, NP 11/14/2022, 1:58 PM

## 2022-11-14 NOTE — Progress Notes (Signed)
   11/14/22 1046  [facility] Triage Screening (Walk-ins at [facility] only)  How Did You Hear About [place]? Self  What Is the Reason for Your Visit/Call Today? Pt presents to [facility] voluntarily seeking meth detox and housing resources. Pt reports he has been living in [facility] for the past 5 years and he has been using meth for 5 years as well. Pt reports he has not used meth in about 1 week. Pt reports he also using marijuana and his last use was 2 days ago. Pt reports occasional alcohol use, last use was yesterdaty 2, 12oz beers. Pt reports he has bladder issues as well. Pt states "I need to get off the streets".  Pt denies SI/HI and AVH.  How Long Has This Been Causing You Problems? <Week  Have You Recently Had Any Thoughts About Hurting Yourself? No  Are You Planning to Commit Suicide/Harm Yourself At This time? No  Have you Recently Had Thoughts About Hurting Someone Karolee Ohs? No  Are You Planning To Harm Someone At This Time? No  Are you currently experiencing any auditory, visual or other hallucinations? No  Have You Used Any Alcohol or Drugs in the Past 24 Hours? Yes  How long ago did you use Drugs or Alcohol? Yesterday  What Did You Use and How Much? 2, 12oz beers  Do you have any current medical co-morbidities that require immediate attention? No  Clinician description of patient physical appearance/behavior: talkative, cooperative, fairly groomed  What Do You Feel Would Help You the Most Today? Alcohol or Drug Use Treatment;Housing Assistance  If access to [facility] was not available, would you have sought care in the Emergency Department? No  Determination of Need Routine (7 days)  Options For Referral Outpatient Therapy;Chemical Dependency Intensive Outpatient Therapy (CDIOP)

## 2023-08-13 DIAGNOSIS — R338 Other retention of urine: Secondary | ICD-10-CM | POA: Diagnosis not present

## 2023-10-17 DIAGNOSIS — R338 Other retention of urine: Secondary | ICD-10-CM | POA: Diagnosis not present

## 2023-11-10 LAB — PSA, DIAGNOSTIC: PSA: 0.5 ng/mL (ref 0.00–4.00)

## 2023-12-03 ENCOUNTER — Encounter

## 2023-12-10 DIAGNOSIS — R338 Other retention of urine: Secondary | ICD-10-CM | POA: Diagnosis not present

## 2024-01-01 ENCOUNTER — Inpatient Hospital Stay: Admit: 2024-01-01 | Payer: Medicare (Managed Care) | Attending: Urology | Primary: Family Medicine

## 2024-01-01 DIAGNOSIS — C61 Malignant neoplasm of prostate: Principal | ICD-10-CM

## 2024-01-01 MED ORDER — IOPAMIDOL 76 % IV SOLN
76 | Freq: Once | INTRAVENOUS | Status: AC | PRN
Start: 2024-01-01 — End: 2024-01-01
  Administered 2024-01-01: 15:00:00 75 mL via INTRAVENOUS

## 2024-01-09 DIAGNOSIS — R338 Other retention of urine: Secondary | ICD-10-CM | POA: Diagnosis not present

## 2024-02-16 DIAGNOSIS — R338 Other retention of urine: Secondary | ICD-10-CM | POA: Diagnosis not present

## 2024-02-25 DIAGNOSIS — R339 Retention of urine, unspecified: Secondary | ICD-10-CM | POA: Diagnosis not present

## 2024-03-18 LAB — TESTOSTERONE: Testosterone: 5 ng/dL — ABNORMAL LOW (ref 193–740)

## 2024-06-17 LAB — TESTOSTERONE: Testosterone: 24 ng/dL — ABNORMAL LOW (ref 193–740)

## 2024-06-17 LAB — PSA, DIAGNOSTIC: PSA: 0.09 ng/mL (ref 0.00–4.00)
